# Patient Record
Sex: Female | Born: 2000 | Hispanic: Yes | Marital: Single | Smoking: Never smoker
Health system: Southern US, Community
[De-identification: ages and names within clinical notes are randomized; demographics above are authoritative.]

## PROBLEM LIST (undated history)

## (undated) DIAGNOSIS — A749 Chlamydial infection, unspecified: Secondary | ICD-10-CM

## (undated) DIAGNOSIS — B999 Unspecified infectious disease: Secondary | ICD-10-CM

## (undated) DIAGNOSIS — D649 Anemia, unspecified: Secondary | ICD-10-CM

## (undated) HISTORY — PX: NO PAST SURGERIES: SHX2092

---
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
## 2018-08-19 LAB — OB RESULTS CONSOLE HIV ANTIBODY (ROUTINE TESTING): HIV: NONREACTIVE

## 2018-08-19 LAB — OB RESULTS CONSOLE GC/CHLAMYDIA: Gonorrhea: NEGATIVE

## 2018-08-19 LAB — OB RESULTS CONSOLE ABO/RH: RH TYPE: POSITIVE

## 2018-08-19 LAB — OB RESULTS CONSOLE ANTIBODY SCREEN: Antibody Screen: NEGATIVE

## 2018-08-19 LAB — OB RESULTS CONSOLE HEPATITIS B SURFACE ANTIGEN: Hepatitis B Surface Ag: NEGATIVE

## 2018-08-19 LAB — OB RESULTS CONSOLE RPR: RPR: NONREACTIVE

## 2018-08-19 LAB — OB RESULTS CONSOLE RUBELLA ANTIBODY, IGM: Rubella: IMMUNE

## 2018-10-11 LAB — OB RESULTS CONSOLE GC/CHLAMYDIA
Chlamydia: NEGATIVE
Gonorrhea: NEGATIVE

## 2018-10-17 LAB — OB RESULTS CONSOLE GBS: GBS: POSITIVE

## 2018-10-21 ENCOUNTER — Encounter (HOSPITAL_COMMUNITY): Payer: Self-pay

## 2018-10-21 ENCOUNTER — Inpatient Hospital Stay (HOSPITAL_COMMUNITY)
Admission: AD | Admit: 2018-10-21 | Discharge: 2018-10-25 | DRG: 807 | Disposition: A | Discharge status: Home / Self Care | Payer: Medicaid Other | Source: Ambulatory Visit | Attending: Obstetrics and Gynecology | Admitting: Obstetrics and Gynecology

## 2018-10-21 DIAGNOSIS — O99824 Streptococcus B carrier state complicating childbirth: Secondary | ICD-10-CM | POA: Diagnosis present

## 2018-10-21 DIAGNOSIS — O9882 Other maternal infectious and parasitic diseases complicating childbirth: Secondary | ICD-10-CM | POA: Diagnosis not present

## 2018-10-21 DIAGNOSIS — Z3A38 38 weeks gestation of pregnancy: Secondary | ICD-10-CM | POA: Diagnosis not present

## 2018-10-21 DIAGNOSIS — O1414 Severe pre-eclampsia complicating childbirth: Principal | ICD-10-CM | POA: Diagnosis present

## 2018-10-21 DIAGNOSIS — O9902 Anemia complicating childbirth: Secondary | ICD-10-CM | POA: Diagnosis present

## 2018-10-21 DIAGNOSIS — D649 Anemia, unspecified: Secondary | ICD-10-CM | POA: Diagnosis present

## 2018-10-21 DIAGNOSIS — O99214 Obesity complicating childbirth: Secondary | ICD-10-CM | POA: Diagnosis present

## 2018-10-21 DIAGNOSIS — Z3A37 37 weeks gestation of pregnancy: Secondary | ICD-10-CM | POA: Diagnosis not present

## 2018-10-21 DIAGNOSIS — O1413 Severe pre-eclampsia, third trimester: Secondary | ICD-10-CM | POA: Diagnosis not present

## 2018-10-21 DIAGNOSIS — O1493 Unspecified pre-eclampsia, third trimester: Secondary | ICD-10-CM | POA: Diagnosis not present

## 2018-10-21 DIAGNOSIS — B951 Streptococcus, group B, as the cause of diseases classified elsewhere: Secondary | ICD-10-CM

## 2018-10-21 HISTORY — DX: Anemia, unspecified: D64.9

## 2018-10-21 HISTORY — DX: Chlamydial infection, unspecified: A74.9

## 2018-10-21 LAB — URINALYSIS, ROUTINE W REFLEX MICROSCOPIC
BILIRUBIN URINE: NEGATIVE
Glucose, UA: NEGATIVE mg/dL
Ketones, ur: NEGATIVE mg/dL
Nitrite: NEGATIVE
PROTEIN: 30 mg/dL — AB
Specific Gravity, Urine: 1.025 (ref 1.005–1.030)
pH: 6 (ref 5.0–8.0)

## 2018-10-21 LAB — COMPREHENSIVE METABOLIC PANEL
ALT: 28 U/L (ref 0–44)
ANION GAP: 6 (ref 5–15)
AST: 31 U/L (ref 15–41)
Albumin: 2.8 g/dL — ABNORMAL LOW (ref 3.5–5.0)
Alkaline Phosphatase: 117 U/L (ref 38–126)
BUN: 10 mg/dL (ref 6–20)
CO2: 21 mmol/L — ABNORMAL LOW (ref 22–32)
Calcium: 8.6 mg/dL — ABNORMAL LOW (ref 8.9–10.3)
Chloride: 107 mmol/L (ref 98–111)
Creatinine, Ser: 0.56 mg/dL (ref 0.44–1.00)
GFR calc Af Amer: >60 mL/min (ref >60)
GFR calc non Af Amer: >60 mL/min (ref >60)
Glucose, Bld: 90 mg/dL (ref 70–99)
Potassium: 3.7 mmol/L (ref 3.5–5.1)
Sodium: 134 mmol/L — ABNORMAL LOW (ref 135–145)
Total Bilirubin: 0.6 mg/dL (ref 0.3–1.2)
Total Protein: 6 g/dL — ABNORMAL LOW (ref 6.5–8.1)

## 2018-10-21 LAB — CBC
HCT: 35.6 % — ABNORMAL LOW (ref 36.0–46.0)
Hemoglobin: 11.7 g/dL — ABNORMAL LOW (ref 12.0–15.0)
MCH: 30.6 pg (ref 26.0–34.0)
MCHC: 32.9 g/dL (ref 30.0–36.0)
MCV: 93.2 fL (ref 80.0–100.0)
PLATELETS: 237 10*3/uL (ref 150–400)
RBC: 3.82 MIL/uL — ABNORMAL LOW (ref 3.87–5.11)
RDW: 13 % (ref 11.5–15.5)
WBC: 9.5 10*3/uL (ref 4.0–10.5)
nRBC: 0 % (ref 0.0–0.2)

## 2018-10-21 LAB — PROTEIN / CREATININE RATIO, URINE
Creatinine, Urine: 121 mg/dL
PROTEIN CREATININE RATIO: 0.33 mg/mg{creat} — AB (ref 0.00–0.15)
Total Protein, Urine: 40 mg/dL

## 2018-10-21 LAB — URINALYSIS, MICROSCOPIC (REFLEX): RBC / HPF: NONE SEEN RBC/hpf (ref 0–5)

## 2018-10-21 LAB — TYPE AND SCREEN
ABO/RH(D): O POS
Antibody Screen: NEGATIVE

## 2018-10-21 MED ORDER — ACETAMINOPHEN 500 MG PO TABS
1000.0000 mg | ORAL_TABLET | Freq: Once | ORAL | Status: AC
  Administered 2018-10-21: 1000 mg via ORAL
  Filled 2018-10-21: qty 2

## 2018-10-21 MED ORDER — MAGNESIUM SULFATE BOLUS VIA INFUSION
4.0000 g | Freq: Once | INTRAVENOUS | Status: AC
  Administered 2018-10-21: 4 g via INTRAVENOUS
  Filled 2018-10-21: qty 500

## 2018-10-21 MED ORDER — SOD CITRATE-CITRIC ACID 500-334 MG/5ML PO SOLN: 30.0000 mL | ORAL | Status: DC | PRN

## 2018-10-21 MED ORDER — OXYTOCIN 40 UNITS IN LACTATED RINGERS INFUSION - SIMPLE MED
2.5000 [IU]/h | INTRAVENOUS | Status: DC
  Filled 2018-10-21: qty 1000

## 2018-10-21 MED ORDER — LACTATED RINGERS IV SOLN: 500.0000 mL | INTRAVENOUS | Status: DC | PRN

## 2018-10-21 MED ORDER — OXYCODONE-ACETAMINOPHEN 5-325 MG PO TABS: 2.0000 | ORAL_TABLET | ORAL | Status: DC | PRN

## 2018-10-21 MED ORDER — SODIUM CHLORIDE 0.9 % IV SOLN
5.0000 10*6.[IU] | Freq: Once | INTRAVENOUS | Status: AC
  Administered 2018-10-21: 5 10*6.[IU] via INTRAVENOUS
  Filled 2018-10-21: qty 5

## 2018-10-21 MED ORDER — OXYCODONE-ACETAMINOPHEN 5-325 MG PO TABS: 1.0000 | ORAL_TABLET | ORAL | Status: DC | PRN

## 2018-10-21 MED ORDER — MAGNESIUM SULFATE 40 G IN LACTATED RINGERS - SIMPLE
2.0000 g/h | INTRAVENOUS | Status: DC
  Administered 2018-10-21 – 2018-10-23 (×3): 2 g/h via INTRAVENOUS
  Filled 2018-10-21 (×3): qty 500

## 2018-10-21 MED ORDER — LABETALOL HCL 5 MG/ML IV SOLN: 80.0000 mg | INTRAVENOUS | Status: DC | PRN

## 2018-10-21 MED ORDER — LACTATED RINGERS IV SOLN
INTRAVENOUS | Status: DC
  Administered 2018-10-21 – 2018-10-23 (×4): via INTRAVENOUS
  Administered 2018-10-23: 82 mL/h via INTRAVENOUS

## 2018-10-21 MED ORDER — LABETALOL HCL 5 MG/ML IV SOLN
20.0000 mg | INTRAVENOUS | Status: DC | PRN
  Filled 2018-10-21: qty 4

## 2018-10-21 MED ORDER — HYDRALAZINE HCL 20 MG/ML IJ SOLN: 10.0000 mg | INTRAMUSCULAR | Status: DC | PRN

## 2018-10-21 MED ORDER — MISOPROSTOL 50MCG HALF TABLET
50.0000 ug | ORAL_TABLET | ORAL | Status: DC
  Administered 2018-10-21 – 2018-10-22 (×4): 50 ug via BUCCAL
  Filled 2018-10-21 (×4): qty 1

## 2018-10-21 MED ORDER — PENICILLIN G 3 MILLION UNITS IVPB - SIMPLE MED
3.0000 10*6.[IU] | INTRAVENOUS | Status: DC
  Administered 2018-10-22 – 2018-10-23 (×12): 3 10*6.[IU] via INTRAVENOUS
  Filled 2018-10-21 (×16): qty 100

## 2018-10-21 MED ORDER — OXYTOCIN BOLUS FROM INFUSION
500.0000 mL | Freq: Once | INTRAVENOUS | Status: AC
  Administered 2018-10-23: 500 mL via INTRAVENOUS

## 2018-10-21 MED ORDER — ACETAMINOPHEN 325 MG PO TABS
650.0000 mg | ORAL_TABLET | ORAL | Status: DC | PRN
  Administered 2018-10-22 (×2): 650 mg via ORAL
  Filled 2018-10-21 (×2): qty 2

## 2018-10-21 MED ORDER — TERBUTALINE SULFATE 1 MG/ML IJ SOLN
0.2500 mg | Freq: Once | INTRAMUSCULAR | Status: DC | PRN
  Filled 2018-10-21: qty 1

## 2018-10-21 MED ORDER — LIDOCAINE HCL (PF) 1 % IJ SOLN
30.0000 mL | INTRAMUSCULAR | Status: DC | PRN
  Administered 2018-10-23: 30 mL via SUBCUTANEOUS
  Filled 2018-10-21: qty 30

## 2018-10-21 MED ORDER — ONDANSETRON HCL 4 MG/2ML IJ SOLN: 4.0000 mg | Freq: Four times a day (QID) | INTRAMUSCULAR | Status: DC | PRN

## 2018-10-21 MED ORDER — LABETALOL HCL 5 MG/ML IV SOLN: 40.0000 mg | INTRAVENOUS | Status: DC | PRN

## 2018-10-21 MED ORDER — FLEET ENEMA 7-19 GM/118ML RE ENEM: 1.0000 | ENEMA | RECTAL | Status: DC | PRN

## 2018-10-21 NOTE — MAU Provider Note (Signed)
Chief Complaint:  Epistaxis and Headache   First Provider Initiated Contact with Patient 10/21/18 1745     HPI: Allison Ferrell is a 17 y.o. G1P0 at 4174w5d who presents to maternity admissions reporting headache & nose bleeds. Symptoms started today. Reports a borderline BP at her last OB visit (135/85) but otherwise denies any history of hypertension. Reports dark red nosebleed off & on today. Bleeding lasts for a few minutes at a time. Headache since this morning. Felt dizzy & reports spots in vision last night. Denies epigastric pain.  Denies contractions, leakage of fluid or vaginal bleeding. Good fetal movement.  Location: head Quality: aching Severity: 4/10 in pain scale Duration: 1 day Timing: constant Modifying factors: nothing makes better or worse Associated signs and symptoms: nose bleed, htn, dizziness   Past Medical History:  Diagnosis Date  . Anemia   . Chlamydia    OB History  Gravida Para Term Preterm AB Living  1            SAB TAB Ectopic Multiple Live Births               # Outcome Date GA Lbr Len/2nd Weight Sex Delivery Anes PTL Lv  1 Current            History reviewed. No pertinent surgical history. No family history on file. Social History   Tobacco Use  . Smoking status: Never Smoker  . Smokeless tobacco: Never Used  Substance Use Topics  . Alcohol use: Never    Frequency: Never  . Drug use: Never   No Known Allergies No medications prior to admission.    I have reviewed patient's Past Medical Hx, Surgical Hx, Family Hx, Social Hx, medications and allergies.   ROS:  Review of Systems  HENT: Positive for nosebleeds. Negative for sneezing and sore throat.   Eyes: Positive for visual disturbance.  Respiratory: Negative.  Negative for cough and shortness of breath.   Gastrointestinal: Negative.   Genitourinary: Negative.   Neurological: Positive for dizziness and headaches.    Physical Exam   Patient Vitals for the past 24 hrs:  BP Temp Temp src Pulse Resp SpO2 Weight  10/21/18 1845 (!) 158/100 - - 87 - - -  10/21/18 1843 (!) 159/103 - - 81 - - -  10/21/18 1820 (!) 156/119 - - 90 - - -  10/21/18 1800 (!) 149/98 - - 91 - - -  10/21/18 1741 (!) 147/103 - - 93 - - -  10/21/18 1734 (!) 144/93 - - 96 - - -  10/21/18 1718 137/89 - - 92 - - -  10/21/18 1716 (!) 155/89 98.3 F (36.8 C) Oral 81 18 100 % 93.4 kg    Constitutional: Well-developed, well-nourished female in no acute distress.  Cardiovascular: normal rate & rhythm, no murmur Respiratory: normal effort, lung sounds clear throughout GI: Abd soft, non-tender, gravid appropriate for gestational age. Pos BS x 4 MS: Extremities nontender, no edema, normal ROM Neurologic: Alert and oriented x 4. DTR 2+ BLE GU:     Dilation: Closed Exam by:: Estanislado SpireE Gurtha Picker NP  NST:  Baseline: 140 bpm, Variability: Good {> 6 bpm), Accelerations: Reactive and Decelerations: Absent   Pt informed that the ultrasound is considered a limited OB ultrasound and is not intended to be a complete ultrasound exam.  Patient also informed that the ultrasound is not being completed with the intent of assessing for fetal or placental anomalies or any pelvic abnormalities.  Explained that  the purpose of today's ultrasound is to assess for  presentation.  Patient acknowledges the purpose of the exam and the limitations of the study.  cephalic     Labs: Results for orders placed or performed during the hospital encounter of 10/21/18 (from the past 24 hour(s))  Urinalysis, Routine w reflex microscopic     Status: Abnormal   Collection Time: 10/21/18  5:37 PM  Result Value Ref Range   Color, Urine YELLOW YELLOW   APPearance HAZY (A) CLEAR   Specific Gravity, Urine 1.025 1.005 - 1.030   pH 6.0 5.0 - 8.0   Glucose, UA NEGATIVE NEGATIVE mg/dL   Hgb urine dipstick TRACE (A) NEGATIVE   Bilirubin Urine NEGATIVE NEGATIVE   Ketones, ur NEGATIVE NEGATIVE mg/dL   Protein, ur 30 (A) NEGATIVE mg/dL    Nitrite NEGATIVE NEGATIVE   Leukocytes, UA SMALL (A) NEGATIVE  Urinalysis, Microscopic (reflex)     Status: Abnormal   Collection Time: 10/21/18  5:37 PM  Result Value Ref Range   RBC / HPF NONE SEEN 0 - 5 RBC/hpf   WBC, UA 6-10 0 - 5 WBC/hpf   Bacteria, UA FEW (A) NONE SEEN   Squamous Epithelial / LPF 11-20 0 - 5  CBC     Status: Abnormal   Collection Time: 10/21/18  6:29 PM  Result Value Ref Range   WBC 9.5 4.0 - 10.5 K/uL   RBC 3.82 (L) 3.87 - 5.11 MIL/uL   Hemoglobin 11.7 (L) 12.0 - 15.0 g/dL   HCT 16.1 (L) 09.6 - 04.5 %   MCV 93.2 80.0 - 100.0 fL   MCH 30.6 26.0 - 34.0 pg   MCHC 32.9 30.0 - 36.0 g/dL   RDW 40.9 81.1 - 91.4 %   Platelets 237 150 - 400 K/uL   nRBC 0.0 0.0 - 0.2 %    Imaging:  No results found.  MAU Course: Orders Placed This Encounter  Procedures  . Urinalysis, Routine w reflex microscopic  . CBC  . Comprehensive metabolic panel  . Protein / creatinine ratio, urine  . Urinalysis, Microscopic (reflex)  . RPR  . Order Rapid HIV per protocol if no results on chart  . Type and screen [facility] [place]  . Insert and maintain IV Line   Meds ordered this encounter  Medications  . acetaminophen (TYLENOL) tablet 1,000 mg    MDM: Elevated BPs. Severe range x 1. Tylenol 1 gm PO for headache. U/a with 30 of protein & rest of PEC labs pending. C/w Dr. Alysia Penna. Will admit.   Assessment: 1. Pre-eclampsia in third trimester   2. [redacted] weeks gestation of pregnancy   3. Positive GBS test     Plan: Admit to birthing suites Confirmed vertex by bedside scan GBS positive, NKDA, abx ordered PEC labs pending Care turned over to labor team  Judeth Horn, NP 10/21/2018 6:48 PM

## 2018-10-21 NOTE — Progress Notes (Signed)
LABOR PROGRESS NOTE  Allison Ferrell is a 17 y.o. G1P0 at 4035w5d admitted for induction of labor for pre-eclampsia. Pregnancy complicated by GBS positive, late prenatal care, chlamydia this pregnancy with unknown TOC  Subjective: Feels well, not feeling contractions. No bleeding or LOF, baby is moving normally  Objective: BP (!) 137/97   Pulse 84   Temp 98.7 F (37.1 C) (Oral)   Resp 18   Ht 5\' 2"  (1.575 m)   Wt 93.4 kg   SpO2 100%   BMI 37.66 kg/m  or  Vitals:   10/21/18 1843 10/21/18 1845 10/21/18 1925 10/21/18 1929  BP: (!) 159/103 (!) 158/100 (!) 137/97   Pulse: 81 87 84   Resp:   18   Temp:   98.7 F (37.1 C)   TempSrc:   Oral   SpO2:      Weight:    93.4 kg  Height:    5\' 2"  (1.575 m)   Dilation: Closed Presentation: Vertex(verifed by bedside US ) Exam by:: Dareen PianoAnderson, DO  FHT: baseline rate 120s, moderate varibility, positive acel, negative decel Toco: every 5 min  Labs: Lab Results  Component Value Date   WBC 9.5 10/21/2018   HGB 11.7 (L) 10/21/2018   HCT 35.6 (L) 10/21/2018   MCV 93.2 10/21/2018   PLT 237 10/21/2018    Patient Active Problem List   Diagnosis Date Noted  . Preeclampsia, third trimester 10/21/2018    Assessment / Plan: 17 y.o. G1P0 at 1135w5d here for induction of labor for pre-eclampsia. Pregnancy complicated by GBS positive, late PNC, chlamydia this pregnancy with unknown TOC.  -P/C ratio 0.33, plt and AST/ALT wnl -continue antibiotics for GBS status -needs TOC for chlamydia   Labor: cephalic by bedside US, cytotec for cervical ripening Fetal Wellbeing:  Reactive  Pain Control:  IV pain medication per patient request Anticipated MOD:  vaginal  Darreon Lutes Londell MohHast Rayetta Veith, MD PGY3 10/21/2018, 7:46 PM

## 2018-10-21 NOTE — MAU Note (Addendum)
Pt has had nose bleeds on and off today. Had some headaches. The nose bleeds do stop but start again, no trauma to the area. Was told by her office to come in. Pt states she's had some mildly elevated BP in the office. Headache is currently 4/10, did not take anything for the headache.

## 2018-10-22 ENCOUNTER — Other Ambulatory Visit: Payer: Self-pay

## 2018-10-22 LAB — ABO/RH: ABO/RH(D): O POS

## 2018-10-22 LAB — RPR: RPR Ser Ql: NONREACTIVE

## 2018-10-22 LAB — GC/CHLAMYDIA PROBE AMP (~~LOC~~) NOT AT ARMC
Chlamydia: NEGATIVE
Neisseria Gonorrhea: NEGATIVE

## 2018-10-22 MED ORDER — FENTANYL CITRATE (PF) 100 MCG/2ML IJ SOLN
100.0000 ug | INTRAMUSCULAR | Status: DC | PRN
  Administered 2018-10-23 (×2): 100 ug via INTRAVENOUS
  Filled 2018-10-22 (×2): qty 2

## 2018-10-22 MED ORDER — MISOPROSTOL 25 MCG QUARTER TABLET
25.0000 ug | ORAL_TABLET | ORAL | Status: DC | PRN
  Administered 2018-10-22 – 2018-10-23 (×4): 25 ug via VAGINAL
  Filled 2018-10-22 (×4): qty 1

## 2018-10-22 NOTE — Progress Notes (Signed)
OB/GYN Faculty Practice: Labor Progress Note  Subjective: Feeling contractions every few minutes.   Objective: BP 128/84   Pulse 80   Temp 98.4 F (36.9 C) (Oral)   Resp 16   Ht 5\' 2"  (1.575 m)   Wt 93.4 kg   SpO2 100%   BMI 37.66 kg/m  Gen: well-appearing, NAD Dilation: 1 Effacement (%): Thick Station: Ballotable Presentation: Vertex Exam by:: Wandra Mannan. Pastva, RN  Assessment and Plan: 17 y.o. G1P0 71102w6d here for IOL for severe preeclampsia (headaches, vision changes and 1 severe range BP) while initially presented to MAU for nosebleeds.   Labor: Induction started around noon 10/22/18 with cytotec. FB still in place. Will give 7th dose of cytotec (now vaginal, previously buccal) -- pain control: plans for epidural -- PPH Risk: medium/high (expect prolonged labor course given Mg++)  Fetal Well-Being: EFW limited by body habitus, was 50% at anatomy U/S, likely 7-8lbs by Leopolds. Cephalic by sutures.  -- Category I - continuous fetal monitoring  -- GBS positive   Severe Preeclampsia (HA, vision changes, 1x severe BP):  BP well-controlled at this time, moderate range BP. Baseline UPC 0.33, HELLP labs wnl. -- continue to monitor Bps -- continue Mg++  Joni Norrod S. Earlene PlaterWallace, DO OB/GYN Fellow, Faculty Practice  10:11 PM

## 2018-10-22 NOTE — Progress Notes (Signed)
LABOR PROGRESS NOTE  Bonnita Nasutilejandra Garcia Ozuna is a 17 y.o. G1P0 at 6950w6d  admitted for IOL for severe Pre-E.   Subjective: Strip note. Discussed plan of care with RN.   Objective: BP 124/66   Pulse 81   Temp 98.7 F (37.1 C) (Oral)   Resp 18   Ht 5\' 2"  (1.575 m)   Wt 93.4 kg   SpO2 100%   BMI 37.66 kg/m  or  Vitals:   10/21/18 2201 10/21/18 2243 10/21/18 2300 10/22/18 0000  BP: 135/89 139/89 (!) 145/87 124/66  Pulse: 81 82 85 81  Resp: 16 18 18    Temp:      TempSrc:      SpO2:      Weight:      Height:        Dilation: Closed Presentation: Vertex Exam by:: Suezanne JacquetJ Williams, RN FHT: baseline rate 130, moderate varibility, +acel, no decel Toco: q2-5 min   Labs: Lab Results  Component Value Date   WBC 9.5 10/21/2018   HGB 11.7 (L) 10/21/2018   HCT 35.6 (L) 10/21/2018   MCV 93.2 10/21/2018   PLT 237 10/21/2018    Patient Active Problem List   Diagnosis Date Noted  . Preeclampsia, third trimester 10/21/2018    Assessment / Plan: 17 y.o. G1P0 at 2850w6d here for IOL for severe Pre-E.   Pre-E: On Mg. No recent severe range pressures.  Labor: Induction. Second cytotec placed at 1200.  Fetal Wellbeing:  Cat I. Patient with approximately 4 min FHT concerning for sinusoidal pattern that resolved to Cat I with position change. Continue to monitor closely. Dr. Alysia PennaErvin was made aware and reviewed strip.  Pain Control:  Plans for epidural  Anticipated MOD:  NSVD   Marcy Sirenatherine Wallace, D.O. OB Fellow  10/22/2018, 12:22 AM

## 2018-10-22 NOTE — Progress Notes (Signed)
LABOR PROGRESS NOTE  Allison Ferrell is a 17 y.o. G1P0 at 9396w6d  admitted for induction of labor for preeclampsia with SF (headaches, vision changes, 1 severe range BP).   Subjective: Doing well. Starting to have more painful contractions. Would like to have IV pain medication on request  Objective: BP (!) 149/93 (BP Location: Right Arm)   Pulse 76   Temp 98 F (36.7 C) (Oral)   Resp 17   Ht 5\' 2"  (1.575 m)   Wt 93.4 kg   SpO2 100%   BMI 37.66 kg/m  or  Vitals:   10/22/18 1356 10/22/18 1500 10/22/18 1600 10/22/18 1651  BP: 128/74 118/80 135/84 (!) 149/93  Pulse: 90 83 87 76  Resp: 16 16 16 17   Temp: (!) 97.3 F (36.3 C)  98 F (36.7 C)   TempSrc: Oral  Oral   SpO2:      Weight:      Height:        Dilation: 1 Effacement (%): Thick Station: Ballotable Presentation: Vertex Exam by:: Wandra Mannan. Pastva, RN FHT: baseline rate 135, moderate varibility, + acel, - decel Toco: irregular contractions  Labs: Lab Results  Component Value Date   WBC 9.5 10/21/2018   HGB 11.7 (L) 10/21/2018   HCT 35.6 (L) 10/21/2018   MCV 93.2 10/21/2018   PLT 237 10/21/2018    Patient Active Problem List   Diagnosis Date Noted  . Preeclampsia, third trimester 10/21/2018    Assessment / Plan: 17 y.o. G1P0 at 5496w6d here for IOL for preeclampsia with severe features (headaches, vision changes, 1 severe range BP) after initially presenting to MAU for nosebleed.  Severe preeclampsia-BP controlled at this time without medication. Baseline UPC 0.33, HELLP wnl  Labor: cytotec x6, FB placed around 1200 on 10/22/18 --IV pain medication per patient request, would like to try and delivery without epidural Fetal Wellbeing:  EFW limited by body habitus, was 50%ile on anatomy scan --reactive strip --GBS positive, receiving PCN  Anticipated MOD:  Anticipate vaginal delivery  Justine NullBridgid Saarah Dewing, MD PGY-3 Family Medicine 10/22/2018, 6:41 PM

## 2018-10-22 NOTE — H&P (Addendum)
LABOR AND DELIVERY ADMISSION HISTORY AND PHYSICAL NOTE  Allison Ferrell is a 17 y.o. female G1P0 with IUP at 68106w6d presenting for IOL for pre-eclampsia. She was seen in the MAU yesterday for HA and nosebleeds and found to have elevated BP's (severe range x 1) and UA with 30 mg/dL of protein.   She reports positive fetal movement. She denies leakage of fluid or vaginal bleeding.  Prenatal History/Complications: PNC at HD Pregnancy complications:  - Late prenatal care - Pre-eclampsia - GBS positive - Chlamydia during pregnancy, unknown TOC  Past Medical History: Past Medical History:  Diagnosis Date  . Anemia   . Chlamydia     Past Surgical History: History reviewed. No pertinent surgical history.  Obstetrical History: OB History    Gravida  1   Para      Term      Preterm      AB      Living        SAB      TAB      Ectopic      Multiple      Live Births              Social History: Social History   Socioeconomic History  . Marital status: Single    Spouse name: Not on file  . Number of children: Not on file  . Years of education: Not on file  . Highest education level: Not on file  Occupational History  . Not on file  Social Needs  . Financial resource strain: Not on file  . Food insecurity:    Worry: Not on file    Inability: Not on file  . Transportation needs:    Medical: Not on file    Non-medical: Not on file  Tobacco Use  . Smoking status: Never Smoker  . Smokeless tobacco: Never Used  Substance and Sexual Activity  . Alcohol use: Never    Frequency: Never  . Drug use: Never  . Sexual activity: Not on file  Lifestyle  . Physical activity:    Days per week: Not on file    Minutes per session: Not on file  . Stress: Not on file  Relationships  . Social connections:    Talks on phone: Not on file    Gets together: Not on file    Attends religious service: Not on file    Active member of club or organization: Not on  file    Attends meetings of clubs or organizations: Not on file    Relationship status: Not on file  Other Topics Concern  . Not on file  Social History Narrative  . Not on file    Family History: History reviewed. No pertinent family history.  Allergies: No Known Allergies  No medications prior to admission.     Review of Systems  All systems reviewed and negative except as stated in HPI  Physical Exam Blood pressure 124/66, pulse 81, temperature 98.7 F (37.1 C), temperature source Oral, resp. rate 19, height 5\' 2"  (1.575 m), weight 93.4 kg, SpO2 100 %. General appearance: alert, oriented, NAD Lungs: normal respiratory effort Heart: regular rate Abdomen: soft, non-tender; gravid, FH appropriate for GA Extremities: No calf swelling or tenderness Presentation: vertex Fetal monitoring: Baseline HR 145, 15x15 Uterine activity: Mild contractions, 60-110s duration, 2-5 min frequency. Dilation: Closed Exam by:: Suezanne JacquetJ Williams, RN  Prenatal labs: ABO, Rh: --/--/O POS (12/16 1827) Antibody: NEG (12/16 1827) Rubella: Immune (10/14 0000)  RPR: Nonreactive (10/14 0000)  HBsAg: Negative (10/14 0000)  HIV: Non-reactive (10/14 0000)  GC/Chlamydia: Positive Chlamydia 08/19/18 GBS: Positive (12/12 0000)  1-hr GTT: 103 Genetic screening: Neg CF screen, Quad screen not completed Anatomy US: None  Prenatal Transfer Tool  Maternal Diabetes: No Genetic Screening: Declined Maternal Ultrasounds/Referrals: Declined Fetal Ultrasounds or other Referrals:  None Maternal Substance Abuse:  No Significant Maternal Medications:  None Significant Maternal Lab Results: Lab values include: Other: Protein Creatinine Ratio .33, 30mg /dL protein in urine.  Results for orders placed or performed during the hospital encounter of 10/21/18 (from the past 24 hour(s))  Urinalysis, Routine w reflex microscopic   Collection Time: 10/21/18  5:37 PM  Result Value Ref Range   Color, Urine YELLOW YELLOW    APPearance HAZY (A) CLEAR   Specific Gravity, Urine 1.025 1.005 - 1.030   pH 6.0 5.0 - 8.0   Glucose, UA NEGATIVE NEGATIVE mg/dL   Hgb urine dipstick TRACE (A) NEGATIVE   Bilirubin Urine NEGATIVE NEGATIVE   Ketones, ur NEGATIVE NEGATIVE mg/dL   Protein, ur 30 (A) NEGATIVE mg/dL   Nitrite NEGATIVE NEGATIVE   Leukocytes, UA SMALL (A) NEGATIVE  Protein / creatinine ratio, urine   Collection Time: 10/21/18  5:37 PM  Result Value Ref Range   Creatinine, Urine 121.00 mg/dL   Total Protein, Urine 40 mg/dL   Protein Creatinine Ratio 0.33 (H) 0.00 - 0.15 mg/mg[Cre]  Urinalysis, Microscopic (reflex)   Collection Time: 10/21/18  5:37 PM  Result Value Ref Range   RBC / HPF NONE SEEN 0 - 5 RBC/hpf   WBC, UA 6-10 0 - 5 WBC/hpf   Bacteria, UA FEW (A) NONE SEEN   Squamous Epithelial / LPF 11-20 0 - 5  Type and screen [facility] [place]   Collection Time: 10/21/18  6:27 PM  Result Value Ref Range   ABO/RH(D) O POS    Antibody Screen NEG    Sample Expiration      10/24/2018 Performed at [facility], [address] [place], [place] [address]   CBC   Collection Time: 10/21/18  6:29 PM  Result Value Ref Range   WBC 9.5 4.0 - 10.5 K/uL   RBC 3.82 (L) 3.87 - 5.11 MIL/uL   Hemoglobin 11.7 (L) 12.0 - 15.0 g/dL   HCT 60.4 (L) 54.0 - 98.1 %   MCV 93.2 80.0 - 100.0 fL   MCH 30.6 26.0 - 34.0 pg   MCHC 32.9 30.0 - 36.0 g/dL   RDW 19.1 47.8 - 29.5 %   Platelets 237 150 - 400 K/uL   nRBC 0.0 0.0 - 0.2 %  Comprehensive metabolic panel   Collection Time: 10/21/18  6:29 PM  Result Value Ref Range   Sodium 134 (L) 135 - 145 mmol/L   Potassium 3.7 3.5 - 5.1 mmol/L   Chloride 107 98 - 111 mmol/L   CO2 21 (L) 22 - 32 mmol/L   Glucose, Bld 90 70 - 99 mg/dL   BUN 10 6 - 20 mg/dL   Creatinine, Ser 6.21 0.44 - 1.00 mg/dL   Calcium 8.6 (L) 8.9 - 10.3 mg/dL   Total Protein 6.0 (L) 6.5 - 8.1 g/dL   Albumin 2.8 (L) 3.5 - 5.0 g/dL   AST 31 15 - 41 U/L   ALT 28 0 - 44 U/L   Alkaline  Phosphatase 117 38 - 126 U/L   Total Bilirubin 0.6 0.3 - 1.2 mg/dL   GFR calc non Af Amer >60 >60  mL/min   GFR calc Af Amer >60 >60 mL/min   Anion gap 6 5 - 15    Patient Active Problem List   Diagnosis Date Noted  . Preeclampsia, third trimester 10/21/2018    Assessment: Aymar Whitfill is a 17 y.o. G1P0 at [redacted]w[redacted]d here for IOL due to pre-eclampsia.   #Labor: Mild contractions every 2-5 min, 60-110s duration. #Pain: Plan for epidural  #FWB: Cat I. Patient with approximately 4 min FHT concerning for sinusoidal pattern that resolved to Cat I with position change. Continue to monitor closely. Dr. Alysia Penna was made aware and reviewed strip.  #ID: Chlamydia during pregnancy, TOC pending. GBS Positive, Penicillin started 0004 hrs. #MOF: Breast #MOC: Possibly IUD #Circ: Theone Murdoch MS3 10/22/2018, 12:29 AM   OB FELLOW HISTORY AND PHYSICAL ATTESTATION  I have seen and examined this patient; I agree with above documentation in the resident's note.   Marcy Siren, D.O. OB Fellow  10/22/2018, 2:55 AM

## 2018-10-22 NOTE — Progress Notes (Signed)
OB/GYN Faculty Practice: Labor Progress Note  Subjective: Sleeping, no complaints.   Objective: BP 128/74   Pulse 90   Temp (!) 97.3 F (36.3 C) (Oral)   Resp 16   Ht 5\' 2"  (1.575 m)   Wt 93.4 kg   SpO2 100%   BMI 37.66 kg/m  Gen: well-appearing, NAD Dilation: Fingertip Station: Ballotable Presentation: Vertex Exam by:: Dr. Earlene PlaterWallace  Assessment and Plan: 17 y.o. G1P0 5541w6d here for IOL for severe preeclampsia (headaches, vision changes and 1 severe range BP) while initially presented to MAU for nosebleeds.   Labor: Induction started around noon 10/22/18 with cytotec. FB placed with speculum as still fingertip. Will switch to vaginal cytotec (5th dose).  -- pain control: plans for epidural -- PPH Risk: medium/high (expect prolonged labor course given Mg++)  Fetal Well-Being: EFW limited by body habitus, was 50% at anatomy U/S, likely 7-8lbs by Leopolds. Cephalic by sutures.  -- Category I - continuous fetal monitoring  -- GBS positive   Severe Preeclampsia (HA, vision changes, 1x severe BP):  BP well-controlled at this time, moderate range BP. Baseline UPC 0.33, HELLP labs wnl. -- continue to monitor Bps  Shaila Gilchrest S. Earlene PlaterWallace, DO OB/GYN Fellow, Faculty Practice  2:14 PM

## 2018-10-22 NOTE — Progress Notes (Signed)
IV saline locked and Monitors removed for patient to shower.  FHR shows Category I tracing.  Level of consciousness appropriate.  Patient ambulated perfectly from bed to shower.

## 2018-10-22 NOTE — Progress Notes (Signed)
OB/GYN Faculty Practice: Labor Progress Note  Subjective: Sleeping when entered room. NAD, brother and mother in the room.   Objective: BP (!) 139/92   Pulse 78   Temp 97.7 F (36.5 C) (Oral)   Resp 16   Ht 5\' 2"  (1.575 m)   Wt 93.4 kg   SpO2 100%   BMI 37.66 kg/m  Gen: well-appearing, NAD Dilation: Fingertip Station: Ballotable Presentation: Vertex Exam by:: Grafton Warzecha  Assessment and Plan: 17 y.o. G1P0 4663w6d here for IOL for severe preeclampsia (headaches, vision changes and 1 severe range BP) while initially presented to MAU for nosebleeds.   Labor: Induction started around noon 10/22/18 with cytotec. Has received 3 doses of cytotec and remains FT. Will give one additional dose of cytotec then plan to place FB with speculum and switch to vaginal cytotec at next check.  -- pain control: plans for epidural -- PPH Risk: medium/high (expect prolonged labor course given Mg++)  Fetal Well-Being: EFW limited by body habitus, was 50% at anatomy U/S, likely 7-8lbs by Leopolds. Cephalic by sutures.  -- Category I - continuous fetal monitoring  -- GBS positive   Severe Preeclampsia (HA, vision changes, 1x severe BP):  BP well-controlled at this time, moderate range BP. Baseline UPC 0.33, HELLP labs wnl. -- continue to monitor Bps  History of Chlamydia: Positive in 10/19, negative test of recurrence 12/19. Spoke with RN at HD who will fax result to labor and delivery. Patient can have post-placental IUD placed.  Cristal DeerLaurel S. Earlene PlaterWallace, DO OB/GYN Fellow, Faculty Practice  9:51 AM

## 2018-10-22 NOTE — Progress Notes (Addendum)
LABOR PROGRESS NOTE  Allison Ferrell is a 17 y.o. G1P0 at 6219w6d  admitted for IOL for severe Pre-E.  Subjective: Has a mild HA. Epistaxis x 2 since 12pm, triggered by coughing. Resolved with applied pressure. Otherwise feeling well. Pt turned out lights, trying to get some rest.  Objective: BP 108/74   Pulse 90   Temp 98.7 F (37.1 C) (Oral)   Resp 16   Ht 5\' 2"  (1.575 m)   Wt 93.4 kg   SpO2 100%   BMI 37.66 kg/m  or  Vitals:   10/22/18 0105 10/22/18 0201 10/22/18 0301 10/22/18 0401  BP: 131/86 126/78 (!) 121/91 108/74  Pulse: 88 85 84 90  Resp: 16 17 18 16   Temp: 98.7 F (37.1 C)     TempSrc: Oral     SpO2:      Weight:      Height:        Dilation: Fingertip Station: Ballotable Presentation: Vertex Exam by:: Suezanne JacquetJ Williams, RN FHT: Baseline rate 125, moderate varibiality, 15 x 15 acel, no decel Toco: q5-6 min, 60-110s duration, mild  Labs: Lab Results  Component Value Date   WBC 9.5 10/21/2018   HGB 11.7 (L) 10/21/2018   HCT 35.6 (L) 10/21/2018   MCV 93.2 10/21/2018   PLT 237 10/21/2018    Patient Active Problem List   Diagnosis Date Noted  . Preeclampsia, third trimester 10/21/2018    Assessment / Plan: 17 y.o. G1P0 at 5619w6d here for IOL due to severe Pre-E.  Pre-E: On Mg, no recent severe ranges pressures.  Labor: IOL, 3rd Cytotec at 0400 Fetal Wellbeing:  Category 1 Pain Control:  Plans for epidural  Anticipated MOD:  SVD  [facility] Surgery CenterElizabeth Evanny Ellerbe MS3 Marcy Sirenatherine Wallace, D.O. OB Fellow  10/22/2018, 4:14 AM

## 2018-10-22 NOTE — Anesthesia Pain Management Evaluation Note (Signed)
  CRNA Pain Management Visit Note  Patient: Allison Ferrell, 17 y.o., female  "Hello [facility]. We have an anesthesia team available at all times to provide care throughout the hospital, including epidural management and anesthesia for C-section. I don't know your plan for the delivery whether it a natural birth, water birth, IV sedation, nitrous supplementation, doula or epidural, but we want to meet your pain goals."   1.Was your pain managed to your expectations on prior hospitalizations?   No prior hospitalizations  2.What is your expectation for pain management during this hospitalization?     Labor support without medications  3.How can we help you reach that goal? natural  Record the patient's initial score and the patient's pain goal.   Pain: 0  Pain Goal: 10 [facility] wants you to be able to say your pain was always managed very well.  Allison Ferrell 10/22/2018

## 2018-10-23 ENCOUNTER — Inpatient Hospital Stay (HOSPITAL_COMMUNITY): Payer: Medicaid Other | Admitting: Anesthesiology

## 2018-10-23 ENCOUNTER — Encounter (HOSPITAL_COMMUNITY): Payer: Self-pay | Admitting: Anesthesiology

## 2018-10-23 DIAGNOSIS — O9882 Other maternal infectious and parasitic diseases complicating childbirth: Secondary | ICD-10-CM

## 2018-10-23 DIAGNOSIS — Z3A38 38 weeks gestation of pregnancy: Secondary | ICD-10-CM

## 2018-10-23 DIAGNOSIS — O1413 Severe pre-eclampsia, third trimester: Secondary | ICD-10-CM

## 2018-10-23 LAB — COMPREHENSIVE METABOLIC PANEL
ALT: 25 U/L (ref 0–44)
AST: 26 U/L (ref 15–41)
Albumin: 2.7 g/dL — ABNORMAL LOW (ref 3.5–5.0)
Alkaline Phosphatase: 108 U/L (ref 38–126)
Anion gap: 9 (ref 5–15)
BUN: <5 mg/dL — ABNORMAL LOW (ref 6–20)
CO2: 20 mmol/L — ABNORMAL LOW (ref 22–32)
Calcium: 7 mg/dL — ABNORMAL LOW (ref 8.9–10.3)
Chloride: 104 mmol/L (ref 98–111)
Creatinine, Ser: 0.53 mg/dL (ref 0.44–1.00)
GFR calc Af Amer: >60 mL/min (ref >60)
GFR calc non Af Amer: >60 mL/min (ref >60)
Glucose, Bld: 89 mg/dL (ref 70–99)
Potassium: 3.4 mmol/L — ABNORMAL LOW (ref 3.5–5.1)
Sodium: 133 mmol/L — ABNORMAL LOW (ref 135–145)
Total Bilirubin: 1 mg/dL (ref 0.3–1.2)
Total Protein: 5.8 g/dL — ABNORMAL LOW (ref 6.5–8.1)

## 2018-10-23 LAB — CBC
HCT: 34 % — ABNORMAL LOW (ref 36.0–46.0)
HCT: 37.2 % (ref 36.0–46.0)
HCT: 35.1 % — ABNORMAL LOW (ref 36.0–46.0)
Hemoglobin: 11.4 g/dL — ABNORMAL LOW (ref 12.0–15.0)
Hemoglobin: 12.7 g/dL (ref 12.0–15.0)
Hemoglobin: 12 g/dL (ref 12.0–15.0)
MCH: 31.1 pg (ref 26.0–34.0)
MCH: 31.5 pg (ref 26.0–34.0)
MCH: 31.3 pg (ref 26.0–34.0)
MCHC: 33.5 g/dL (ref 30.0–36.0)
MCHC: 34.1 g/dL (ref 30.0–36.0)
MCHC: 34.2 g/dL (ref 30.0–36.0)
MCV: 92.9 fL (ref 80.0–100.0)
MCV: 92.3 fL (ref 80.0–100.0)
MCV: 91.4 fL (ref 80.0–100.0)
NRBC: 0 % (ref 0.0–0.2)
NRBC: 0 % (ref 0.0–0.2)
NRBC: 0 % (ref 0.0–0.2)
Platelets: 226 10*3/uL (ref 150–400)
Platelets: 250 10*3/uL (ref 150–400)
Platelets: 219 10*3/uL (ref 150–400)
RBC: 3.66 MIL/uL — ABNORMAL LOW (ref 3.87–5.11)
RBC: 4.03 MIL/uL (ref 3.87–5.11)
RBC: 3.84 MIL/uL — ABNORMAL LOW (ref 3.87–5.11)
RDW: 13.1 % (ref 11.5–15.5)
RDW: 13.1 % (ref 11.5–15.5)
RDW: 13.2 % (ref 11.5–15.5)
WBC: 10.7 10*3/uL — ABNORMAL HIGH (ref 4.0–10.5)
WBC: 13.9 10*3/uL — ABNORMAL HIGH (ref 4.0–10.5)
WBC: 18.7 10*3/uL — ABNORMAL HIGH (ref 4.0–10.5)

## 2018-10-23 LAB — MAGNESIUM: Magnesium: 4.5 mg/dL — ABNORMAL HIGH (ref 1.7–2.4)

## 2018-10-23 MED ORDER — MISOPROSTOL 50MCG HALF TABLET
50.0000 ug | ORAL_TABLET | Freq: Once | ORAL | Status: AC
  Administered 2018-10-23: 50 ug via BUCCAL
  Filled 2018-10-23: qty 1

## 2018-10-23 MED ORDER — EPHEDRINE 5 MG/ML INJ
10.0000 mg | INTRAVENOUS | Status: DC | PRN
  Filled 2018-10-23: qty 2

## 2018-10-23 MED ORDER — LIDOCAINE HCL (PF) 1 % IJ SOLN
INTRAMUSCULAR | Status: DC | PRN
  Administered 2018-10-23 (×2): 4 mL via EPIDURAL

## 2018-10-23 MED ORDER — LACTATED RINGERS IV SOLN: 500.0000 mL | Freq: Once | INTRAVENOUS | Status: DC

## 2018-10-23 MED ORDER — LACTATED RINGERS IV SOLN
500.0000 mL | Freq: Once | INTRAVENOUS | Status: AC
  Administered 2018-10-23: 500 mL via INTRAVENOUS

## 2018-10-23 MED ORDER — TRANEXAMIC ACID-NACL 1000-0.7 MG/100ML-% IV SOLN: 1000.0000 mg | Freq: Once | INTRAVENOUS | Status: DC

## 2018-10-23 MED ORDER — FENTANYL 2.5 MCG/ML BUPIVACAINE 1/10 % EPIDURAL INFUSION (WH - ANES)
14.0000 mL/h | INTRAMUSCULAR | Status: DC | PRN
  Administered 2018-10-23: 12 mL/h via EPIDURAL
  Administered 2018-10-23: 14 mL/h via EPIDURAL
  Filled 2018-10-23 (×2): qty 100

## 2018-10-23 MED ORDER — DIPHENHYDRAMINE HCL 50 MG/ML IJ SOLN: 12.5000 mg | INTRAMUSCULAR | Status: DC | PRN

## 2018-10-23 MED ORDER — OXYTOCIN 40 UNITS IN LACTATED RINGERS INFUSION - SIMPLE MED
1.0000 m[IU]/min | INTRAVENOUS | Status: DC
  Administered 2018-10-23: 2 m[IU]/min via INTRAVENOUS

## 2018-10-23 MED ORDER — TERBUTALINE SULFATE 1 MG/ML IJ SOLN
0.2500 mg | Freq: Once | INTRAMUSCULAR | Status: DC | PRN
  Filled 2018-10-23: qty 1

## 2018-10-23 MED ORDER — PHENYLEPHRINE 40 MCG/ML (10ML) SYRINGE FOR IV PUSH (FOR BLOOD PRESSURE SUPPORT)
80.0000 ug | PREFILLED_SYRINGE | INTRAVENOUS | Status: DC | PRN
  Filled 2018-10-23: qty 10

## 2018-10-23 MED ORDER — PHENYLEPHRINE 40 MCG/ML (10ML) SYRINGE FOR IV PUSH (FOR BLOOD PRESSURE SUPPORT)
80.0000 ug | PREFILLED_SYRINGE | INTRAVENOUS | Status: DC | PRN
  Filled 2018-10-23 (×2): qty 10

## 2018-10-23 NOTE — Progress Notes (Signed)
OB/GYN Faculty Practice: Labor Progress Note  Subjective: Doing well, has been sleeping since FB came out.   Objective: BP (!) 142/99   Pulse 95   Temp 98.3 F (36.8 C) (Oral)   Resp 16   Ht 5\' 2"  (1.575 m)   Wt 93.4 kg   SpO2 100%   BMI 37.66 kg/m  Gen: well-appearing, NAD Dilation: 4 Effacement (%): Thick Cervical Position: Posterior Station: -3 Presentation: Vertex Exam by:: Dr. Marlis EdelsonL Malick Netz  Assessment and Plan: 17 y.o. G1P0 129w6d here for IOL for severe preeclampsia (headaches, vision changes and 1 severe range BP) while initially presented to MAU for nosebleeds.   Labor: Induction started around noon 10/22/18 with cytotec. FB out, cervix is still posterior FB 4/thick/-3. Will give one additional dose of cytotec (9th dose, switching back to buccal) then plan to start pitocin at 1000.  -- pain control: plans for epidural -- PPH Risk: medium/high (expect prolonged labor course given Mg++)  Fetal Well-Being: EFW limited by body habitus, was 50% at anatomy U/S, likely 7-8lbs by Leopolds. Cephalic by sutures.  -- Category I - continuous fetal monitoring  -- GBS positive   Severe Preeclampsia (HA, vision changes, 1x severe BP):  BP well-controlled at this time, moderate range BP. Baseline UPC 0.33, HELLP labs wnl. -- continue to monitor Bps -- continue Mg++  Ben Sanz S. Earlene PlaterWallace, DO OB/GYN Fellow, Faculty Practice  6:31 AM

## 2018-10-23 NOTE — Progress Notes (Signed)
LABOR PROGRESS NOTE  Bonnita Nasutilejandra Garcia Ozuna is a 17 y.o. G1P0 at 9472w0d  admitted for IOL 2/2 severe Pre-e  Subjective: Patient is still feeling lower abdominal discomfort.   Objective: BP (!) 147/89   Pulse 92   Temp 97.6 F (36.4 C) (Oral)   Resp (P) 18   Ht 5\' 2"  (1.575 m)   Wt 93.4 kg   SpO2 98%   BMI 37.66 kg/m  or  Vitals:   10/23/18 1531 10/23/18 1535 10/23/18 1601 10/23/18 1631  BP: (!) 139/94 (!) 133/91 (!) 149/94 (!) 147/89  Pulse: 91 92 89 92  Resp: 20  18 (P) 18  Temp:      TempSrc:      SpO2:      Weight:      Height:        ~1640 Dilation: (P) 5.5 Effacement (%): (P) 80 Cervical Position: (P) Middle Station: (P) -2 Presentation: (P) Vertex Exam by:: (P) sowder FHT: baseline rate 125, moderate varibility, positive acel, early decel Toco: 3-4 minutes  Labs: Lab Results  Component Value Date   WBC 13.9 (H) 10/23/2018   HGB 12.7 10/23/2018   HCT 37.2 10/23/2018   MCV 92.3 10/23/2018   PLT 250 10/23/2018    Patient Active Problem List   Diagnosis Date Noted  . Preeclampsia, third trimester 10/21/2018    Assessment / Plan: 17 y.o. G1P0 at 5372w0d here for IOL 2/2 severe Pre-e  Labor: patient cervix is progressing. Pitocin 14. More clear amniotic fluid leakage. Since having good contractions on monitor and slow cervix change, elected to insert IUPC at this time for greater monitoring of contraction adequacy. Placed successfully with good contractions. Fetal Wellbeing:  Category I Pain Control:  Epidural in place Anticipated MOD:  vaginal  Aalani Aikens DO  10/23/2018, 4:47 PM

## 2018-10-23 NOTE — Anesthesia Procedure Notes (Signed)
Epidural Patient location during procedure: OB Start time: 10/23/2018 3:02 PM End time: 10/23/2018 3:10 PM  Staffing Anesthesiologist: Mal AmabileFoster, Oniya Mandarino, MD Performed: anesthesiologist   Preanesthetic Checklist Completed: patient identified, site marked, surgical consent, pre-op evaluation, timeout performed, IV checked, risks and benefits discussed and monitors and equipment checked  Epidural Patient position: sitting Prep: site prepped and draped and DuraPrep Patient monitoring: continuous pulse ox and blood pressure Approach: midline Location: L3-L4 Injection technique: LOR air  Needle:  Needle type: Tuohy  Needle gauge: 17 G Needle length: 9 cm and 9 Needle insertion depth: 5 cm cm Catheter type: closed end flexible Catheter size: 19 Gauge Catheter at skin depth: 10 cm Test dose: negative and Other  Assessment Events: blood not aspirated, injection not painful, no injection resistance, negative IV test and no paresthesia  Additional Notes Patient identified. Risks and benefits discussed including failed block, incomplete  Pain control, post dural puncture headache, nerve damage, paralysis, blood pressure Changes, nausea, vomiting, reactions to medications-both toxic and allergic and post Partum back pain. All questions were answered. Patient expressed understanding and wished to proceed. Sterile technique was used throughout procedure. Epidural site was Dressed with sterile barrier dressing. No paresthesias, signs of intravascular injection Or signs of intrathecal spread were encountered.  Patient was more comfortable after the epidural was dosed. Please see RN's note for documentation of vital signs and FHR which are stable. Reason for block:procedure for pain

## 2018-10-23 NOTE — Anesthesia Preprocedure Evaluation (Addendum)
Anesthesia Evaluation  Patient identified by MRN, date of birth, ID band Patient awake    Reviewed: Allergy & Precautions, Patient's Chart, lab work & pertinent test results  Airway Mallampati: II  TM Distance: >3 FB Neck ROM: Full    Dental no notable dental hx. (+) Teeth Intact   Pulmonary neg pulmonary ROS,    Pulmonary exam normal breath sounds clear to auscultation       Cardiovascular hypertension, Normal cardiovascular exam Rhythm:Regular Rate:Normal     Neuro/Psych negative neurological ROS  negative psych ROS   GI/Hepatic Neg liver ROS, GERD  ,  Endo/Other  Obesity  Renal/GU negative Renal ROS  negative genitourinary   Musculoskeletal negative musculoskeletal ROS (+)   Abdominal (+) + obese,   Peds  Hematology  (+) anemia ,   Anesthesia Other Findings   Reproductive/Obstetrics (+) Pregnancy Pre eclampsia- on MgSO4                             Anesthesia Physical Anesthesia Plan  ASA: III  Anesthesia Plan: Epidural   Post-op Pain Management:    Induction:   PONV Risk Score and Plan:   Airway Management Planned: Natural Airway  Additional Equipment:   Intra-op Plan:   Post-operative Plan:   Informed Consent: I have reviewed the patients History and Physical, chart, labs and discussed the procedure including the risks, benefits and alternatives for the proposed anesthesia with the patient or authorized representative who has indicated his/her understanding and acceptance.     Plan Discussed with: Anesthesiologist  Anesthesia Plan Comments:         Anesthesia Quick Evaluation

## 2018-10-23 NOTE — Progress Notes (Signed)
OB/GYN Faculty Practice: Labor Progress Note  Subjective: Strip note. Plan of care discussed with RN. FB is now out, feeling increasingly more uncomfortable with contractions.   Objective: BP 139/84   Pulse 99   Temp 98.5 F (36.9 C) (Oral)   Resp 16   Ht 5\' 2"  (1.575 m)   Wt 93.4 kg   SpO2 100%   BMI 37.66 kg/m  Gen: well-appearing, NAD Dilation: 4 Effacement (%): 30, 40 Cervical Position: Posterior Station: Ballotable Presentation: Vertex Exam by:: Gearldine Bienenstockiana Castillo, RN  Assessment and Plan: 17 y.o. G1P0 3735w6d here for IOL for severe preeclampsia (headaches, vision changes and 1 severe range BP) while initially presented to MAU for nosebleeds.   Labor: Induction started around noon 10/22/18 with cytotec. FB now out! Will give 8th dose of cytotec, still some thickness/firmness to cervix.  -- pain control: plans for epidural -- PPH Risk: medium/high (expect prolonged labor course given Mg++)  Fetal Well-Being: EFW limited by body habitus, was 50% at anatomy U/S, likely 7-8lbs by Leopolds. Cephalic by sutures.  -- Category I - continuous fetal monitoring  -- GBS positive   Severe Preeclampsia (HA, vision changes, 1x severe BP):  BP well-controlled at this time, moderate range BP. Baseline UPC 0.33, HELLP labs wnl. -- continue to monitor Bps -- continue Mg++  Mitali Shenefield S. Earlene PlaterWallace, DO OB/GYN Fellow, Faculty Practice  3:52 AM

## 2018-10-23 NOTE — Progress Notes (Signed)
LABOR PROGRESS NOTE  Allison Ferrell is a 17 y.o. G1P0 at 8024w0d  admitted for IOL 2/2 severe Pre-e  Subjective: Patient has increasing discomfort with contractions which she rates 4/10 at this time.   Objective: BP (!) 151/94   Pulse (!) 106   Temp 98.3 F (36.8 C) (Oral)   Resp 20   Ht 5\' 2"  (1.575 m)   Wt 93.4 kg   SpO2 100%   BMI 37.66 kg/m  or  Vitals:   10/23/18 0859 10/23/18 0901 10/23/18 1001 10/23/18 1007  BP:  (!) 144/93 (!) 157/111 (!) 151/94  Pulse:  95 (!) 105 (!) 106  Resp: 18  20   Temp:      TempSrc:      SpO2:      Weight:      Height:        ~1000 Dilation: 5 Effacement (%): Thick Cervical Position: Posterior Station: -3 Presentation: Vertex Exam by:: Momoko Slezak FHT: baseline rate 135, moderate varibility, positive acel, negative decel Toco: 2-4 minutes, mild  Labs: Lab Results  Component Value Date   WBC 10.7 (H) 10/23/2018   HGB 11.4 (L) 10/23/2018   HCT 34.0 (L) 10/23/2018   MCV 92.9 10/23/2018   PLT 226 10/23/2018    Patient Active Problem List   Diagnosis Date Noted  . Preeclampsia, third trimester 10/21/2018    Assessment / Plan: 17 y.o. G1P0 at 8124w0d here for IOL  Labor: progressing slowly cervix ~5/thick/posterior, -3 station. Vertex position. Starting pitocin at this time. BP elevated during this check but taken while patient was moving so will recheck. Fetal Wellbeing:  Category I Pain Control:  None at this time. IV fentanyl ordered Anticipated MOD:  vaginal  Jamelle Rushinghelsey Denaisha Swango 10/23/2018, 10:07 AM

## 2018-10-24 ENCOUNTER — Encounter (HOSPITAL_COMMUNITY): Payer: Self-pay

## 2018-10-24 LAB — CBC
HCT: 31.4 % — ABNORMAL LOW (ref 36.0–46.0)
Hemoglobin: 10.9 g/dL — ABNORMAL LOW (ref 12.0–15.0)
MCH: 31.7 pg (ref 26.0–34.0)
MCHC: 34.7 g/dL (ref 30.0–36.0)
MCV: 91.3 fL (ref 80.0–100.0)
Platelets: 222 10*3/uL (ref 150–400)
RBC: 3.44 MIL/uL — ABNORMAL LOW (ref 3.87–5.11)
RDW: 13.3 % (ref 11.5–15.5)
WBC: 17.8 10*3/uL — ABNORMAL HIGH (ref 4.0–10.5)
nRBC: 0 % (ref 0.0–0.2)

## 2018-10-24 MED ORDER — DIBUCAINE 1 % RE OINT: 1.0000 "application " | TOPICAL_OINTMENT | RECTAL | Status: DC | PRN

## 2018-10-24 MED ORDER — ONDANSETRON HCL 4 MG/2ML IJ SOLN: 4.0000 mg | INTRAMUSCULAR | Status: DC | PRN

## 2018-10-24 MED ORDER — PRENATAL MULTIVITAMIN CH
1.0000 | ORAL_TABLET | Freq: Every day | ORAL | Status: DC
  Administered 2018-10-24 – 2018-10-25 (×2): 1 via ORAL
  Filled 2018-10-24 (×2): qty 1

## 2018-10-24 MED ORDER — LACTATED RINGERS IV SOLN
INTRAVENOUS | Status: AC
  Administered 2018-10-24 (×2): via INTRAVENOUS

## 2018-10-24 MED ORDER — BENZOCAINE-MENTHOL 20-0.5 % EX AERO: 1.0000 "application " | INHALATION_SPRAY | CUTANEOUS | Status: DC | PRN

## 2018-10-24 MED ORDER — MAGNESIUM SULFATE 40 G IN LACTATED RINGERS - SIMPLE
2.0000 g/h | INTRAVENOUS | Status: AC
  Administered 2018-10-24: 2 g/h via INTRAVENOUS
  Filled 2018-10-24: qty 500

## 2018-10-24 MED ORDER — IBUPROFEN 600 MG PO TABS
600.0000 mg | ORAL_TABLET | Freq: Four times a day (QID) | ORAL | Status: DC
  Administered 2018-10-24 – 2018-10-25 (×7): 600 mg via ORAL
  Filled 2018-10-24 (×7): qty 1

## 2018-10-24 MED ORDER — WITCH HAZEL-GLYCERIN EX PADS: 1.0000 "application " | MEDICATED_PAD | CUTANEOUS | Status: DC | PRN

## 2018-10-24 MED ORDER — TETANUS-DIPHTH-ACELL PERTUSSIS 5-2.5-18.5 LF-MCG/0.5 IM SUSP: 0.5000 mL | Freq: Once | INTRAMUSCULAR | Status: DC

## 2018-10-24 MED ORDER — COCONUT OIL OIL
1.0000 "application " | TOPICAL_OIL | Status: DC | PRN
  Administered 2018-10-24: 1 via TOPICAL
  Filled 2018-10-24: qty 120

## 2018-10-24 MED ORDER — SIMETHICONE 80 MG PO CHEW: 80.0000 mg | CHEWABLE_TABLET | ORAL | Status: DC | PRN

## 2018-10-24 MED ORDER — ONDANSETRON HCL 4 MG PO TABS: 4.0000 mg | ORAL_TABLET | ORAL | Status: DC | PRN

## 2018-10-24 MED ORDER — SENNOSIDES-DOCUSATE SODIUM 8.6-50 MG PO TABS
2.0000 | ORAL_TABLET | ORAL | Status: DC
  Administered 2018-10-24 – 2018-10-25 (×2): 2 via ORAL
  Filled 2018-10-24 (×2): qty 2

## 2018-10-24 MED ORDER — ACETAMINOPHEN 325 MG PO TABS: 650.0000 mg | ORAL_TABLET | ORAL | Status: DC | PRN

## 2018-10-24 NOTE — Progress Notes (Signed)
CSW acknowledged consult and attempted to see MOB to complete assessment. MOB was asleep and infant was asleep. CSW will attempt to see MOB at a later time.   Celso SickleKimberly Haden Cavenaugh, LCSWA Clinical Social Worker [facility] Cell#: [phone]

## 2018-10-24 NOTE — Progress Notes (Signed)
CSW attempted to see MOB to complete assessment. MOB was receiving magnesium, CSW will see MOB once magnesium is discontinued.  Allison Ferrell, LCSWA Clinical Social Worker [facility] Cell#: [phone]

## 2018-10-24 NOTE — Progress Notes (Signed)
Daily Antepartum Note  Admission Date: 10/21/2018 Current Date: 10/24/2018 8:49 AM  Allison Ferrell is a 17 y.o. G1P1001 PPD#1 SVD/1st (repaired) @ [redacted]w[redacted]d. Patient admitted for IOL for pre-eclampsia with severe features (BP, protein)  Pregnancy complicated by: Patient Active Problem List   Diagnosis Date Noted  . Preeclampsia, third trimester 10/21/2018    Overnight/24hr events:  none  Subjective:  No s/s of pre-eclampsia. Normal lochia, minimal pain  Objective:    Current Vital Signs 24h Vital Sign Ranges  T 97.8 F (36.6 C) Temp  Avg: 98.6 F (37 C)  Min: 97.6 F (36.4 C)  Max: 100.2 F (37.9 C)  BP 134/80 BP  Min: 131/97  Max: 164/94  HR 84 Pulse  Avg: 97.9  Min: 84  Max: 121  RR 18 Resp  Avg: 18.7  Min: 16  Max: 20  SaO2 96 % Room Air SpO2  Avg: 98.4 %  Min: 96 %  Max: 100 %       24 Hour I/O Current Shift I/O  Time Ins Outs 12/18 0701 - 12/19 0700 In: 3609.3 [P.O.:1680; I.V.:1929.3] Out: 3789 [Urine:3250] 12/19 0701 - 12/19 1900 In: 1019.8 [P.O.:720; I.V.:299.8] Out: 700 [Urine:700]   Patient Vitals for the past 24 hrs:  BP Temp Temp src Pulse Resp SpO2  10/24/18 0800 - - - - 18 -  10/24/18 0425 134/80 97.8 F (36.6 C) Oral 84 18 96 %  10/24/18 0009 (!) 147/89 98.6 F (37 C) Oral (!) 109 20 100 %  10/23/18 2256 (!) 149/93 100.2 F (37.9 C) Oral (!) 105 20 100 %  10/23/18 2200 140/81 - - (!) 109 - -  10/23/18 2145 (!) 141/80 - - 96 - -  10/23/18 2130 (!) 131/97 99 F (37.2 C) Oral (!) 121 - -  10/23/18 2115 (!) 155/87 - - (!) 108 - -  10/23/18 2100 (!) 144/86 - - (!) 111 - -  10/23/18 2030 (!) 164/94 - - (!) 106 - -  10/23/18 2000 (!) 163/101 98.6 F (37 C) Oral (!) 104 - -  10/23/18 1931 (!) 155/100 - - 100 - -  10/23/18 1904 (!) 159/82 - - 95 18 -  10/23/18 1831 132/75 - - 87 20 -  10/23/18 1801 (!) 145/87 98.5 F (36.9 C) Oral 96 20 -  10/23/18 1731 (!) 148/92 - - 99 - -  10/23/18 1701 (!) 142/88 - - 95 18 -  10/23/18 1631 (!) 147/89 - -  92 18 -  10/23/18 1601 (!) 149/94 - - 89 18 -  10/23/18 1535 (!) 133/91 - - 92 - -  10/23/18 1531 (!) 139/94 - - 91 20 -  10/23/18 1530 - - - - - 98 %  10/23/18 1526 135/87 - - 90 - -  10/23/18 1521 139/87 - - 89 - 97 %  10/23/18 1520 - - - - - 97 %  10/23/18 1516 (!) 145/89 97.6 F (36.4 C) Oral 90 18 -  10/23/18 1515 - - - - - 99 %  10/23/18 1511 (!) 142/89 - - 98 - -  10/23/18 1510 - - - - - 100 %  10/23/18 1507 (!) 147/86 - - 92 - -  10/23/18 1502 - - - - 18 -  10/23/18 1448 (!) 151/90 - - 96 - -  10/23/18 1443 (!) 147/95 - - 98 20 -  10/23/18 1438 (!) 164/109 - - (!) 108 - -  10/23/18 1401 (!) 141/101 98.5 F (36.9 C)  Oral (!) 106 20 -  10/23/18 1350 (!) 154/95 - - 97 - -  10/23/18 1331 (!) 145/94 - - 91 - -  10/23/18 1301 (!) 147/99 - - 92 - -  10/23/18 1259 - - - - 18 -  10/23/18 1231 (!) 150/102 - - 94 - -  10/23/18 1201 (!) 141/108 - - 91 18 -  10/23/18 1131 (!) 146/96 - - 97 18 -  10/23/18 1101 139/84 - - 97 - -  10/23/18 1100 - - - - 16 -  10/23/18 1031 138/90 98.2 F (36.8 C) Oral 95 - -  10/23/18 1007 (!) 151/94 - - (!) 106 20 -  10/23/18 1001 (!) 157/111 - - (!) 105 20 -  10/23/18 0901 (!) 144/93 - - 95 - -  10/23/18 0859 - - - - 18 -  UOP: >19400mL/hr  Physical exam: General: Well nourished, well developed female in no acute distress. Abdomen: obese, nttp Cardiovascular: S1, S2 normal, no murmur, rub or gallop, regular rate and rhythm Respiratory: CTAB Extremities: no clubbing, cyanosis or edema Skin: Warm and dry.   Medications: Current Facility-Administered Medications  Medication Dose Route Frequency Provider Last Rate Last Dose  . acetaminophen (TYLENOL) tablet 650 mg  650 mg Oral Q4H PRN Wilson, Bridgid H, MD      . benzocaine-Menthol (DERMOPLAST) 20-0.5 % topical spray 1 application  1 application Topical PRN Wilson, Bridgid H, MD      . coconut oil  1 application Topical PRN Henderson NewcomerWilson, Bridgid H, MD      . witch hazel-glycerin (TUCKS) pad 1  application  1 application Topical PRN Wilson, Bridgid H, MD       And  . dibucaine (NUPERCAINAL) 1 % rectal ointment 1 application  1 application Rectal PRN Henderson NewcomerWilson, Bridgid H, MD      . ibuprofen (ADVIL,MOTRIN) tablet 600 mg  600 mg Oral Q6H Wilson, Bridgid H, MD   600 mg at 10/24/18 0606  . lactated ringers infusion   Intravenous Continuous Kathrynn RunningWouk, Noah Bedford, MD 75 mL/hr at 10/24/18 [phone]    . lidocaine (PF) (XYLOCAINE) 1 % injection 30 mL  30 mL Subcutaneous PRN Judeth HornLawrence, Erin, NP   30 mL at 10/23/18 2055  . magnesium sulfate 40 grams in LR 500 mL OB infusion  2 g/hr Intravenous Continuous Lazaro ArmsEure, Luther H, MD      . ondansetron [facility](ZOFRAN) tablet 4 mg  4 mg Oral Q4H PRN Henderson NewcomerWilson, Bridgid H, MD       Or  . ondansetron (ZOFRAN) injection 4 mg  4 mg Intravenous Q4H PRN Andrey CampanileWilson, Bridgid H, MD      . prenatal multivitamin tablet 1 tablet  1 tablet Oral Q1200 Wilson, Bridgid H, MD      . senna-docusate (Senokot-S) tablet 2 tablet  2 tablet Oral Q24H Henderson NewcomerWilson, Bridgid H, MD   2 tablet at 10/24/18 0125  . simethicone (MYLICON) chewable tablet 80 mg  80 mg Oral PRN Andrey CampanileWilson, Bridgid H, MD      . Tdap (BOOSTRIX) injection 0.5 mL  0.5 mL Intramuscular Once Henderson NewcomerWilson, Bridgid H, MD        Labs:  Recent Labs  Lab 10/23/18 1440 10/23/18 2207 10/24/18 0549  WBC 13.9* 18.7* 17.8*  HGB 12.7 12.0 10.9*  HCT 37.2 35.1* 31.4*  PLT 250 219 222    Recent Labs  Lab 10/21/18 1829 10/23/18 0644  NA 134* 133*  K 3.7 3.4*  CL 107 104  CO2 21* 20*  BUN  10 <5*  CREATININE 0.56 0.53  CALCIUM 8.6* 7.0*  PROT 6.0* 5.8*  BILITOT 0.6 1.0  ALKPHOS 117 108  ALT 28 25  AST 31 26  GLUCOSE 90 89    Radiology: no new imaging  Assessment & Plan:  Pt stable  *PP: routine care. Pt unsure of BC. I d/w her re: [place] program and could get nexplanon prior to d/c. Pt to consider. O pos, breast.  *Severe pre-eclampsia: follow BPs. May need to start PO meds *PPx: SCDs, bedrest with bathroom priv. *FEN/GI: regular  diet. MIVF, Mg x 24h *Dispo: likely tomorrow  Cornelia Copaharlie Selena Swaminathan, Jr. MD Attending Center for [facility](Faculty Practice)

## 2018-10-24 NOTE — Lactation Note (Signed)
This note was copied from a baby's chart. Lactation Consultation Note  Patient Name: Boy Bonnita Nasutilejandra Garcia Ozuna ZOXWR'UToday's Date: 10/24/2018 Reason for consult: Initial assessment;Early term 37-38.6wks;Primapara;1st time breastfeeding  P1 mother whose infant is now 3015 hours old.    Mother feels like breast feeding is going well so far, however, she stated that she has not fed on her left breast because of nipple pain.  Her breasts are soft and non tender and her nipples are everted.  She does have 2 small bumps around her nipple on the left side but no breakdown or redness noted.  RN will bring in coconut oil for comfort.  Encouraged her to feed 8-12 times/24 hours or sooner if baby shows feeding cues.  Reviewed feeding cues with mother.  Taught hand expression and mother did a return demonstration with a few drops of colostrum from the right breast which I finger fed back to baby.  Suggested mother continue to do hand expression before/after latching to help increase milk supply.  Explained the importance of putting baby to left breast and/or hand expression so this will facilitate increasing milk supply; leaving the breast idle will diminish the supply which mother is now aware of.  I asked her to call for latch assistance the next time baby is ready to feed so I may help assess why the left nipple hurts when feeding.  She has been using the cradle hold and I did caution her about possibly trying a different position when she calls me back.    Mom made aware of O/P services, breastfeeding support groups, community resources, and our phone # for post-discharge questions.  Mother does not have a DEBP for home use but [facility].  Referral made.   Maternal Data Formula Feeding for Exclusion: No Has patient been taught Hand Expression?: Yes Does the patient have breastfeeding experience prior to this delivery?: No  Feeding Feeding Type: Breast Fed  LATCH  Score                   Interventions    Lactation Tools Discussed/Used WIC Program: Yes   Consult Status Consult Status: Follow-up Date: 10/25/18 Follow-up type: In-patient    Corene Resnick R Jeffie Widdowson 10/24/2018, 11:54 AM

## 2018-10-24 NOTE — Anesthesia Postprocedure Evaluation (Signed)
Anesthesia Post Note  Patient: Allison Ferrell  Procedure(s) Performed: AN AD HOC LABOR EPIDURAL     Patient location during evaluation: Mother Baby Anesthesia Type: Epidural Level of consciousness: awake and alert Pain management: pain level controlled Vital Signs Assessment: post-procedure vital signs reviewed and stable Respiratory status: spontaneous breathing, nonlabored ventilation and respiratory function stable Cardiovascular status: stable Postop Assessment: no headache, no backache and epidural receding Anesthetic complications: no    Last Vitals:  Vitals:   10/24/18 0009 10/24/18 0425  BP: (!) 147/89 134/80  Pulse: (!) 109 84  Resp: 20 18  Temp: 37 C 36.6 C  SpO2: 100% 96%    Last Pain:  Vitals:   10/24/18 0425  TempSrc: Oral  PainSc:    Pain Goal:                 [name]

## 2018-10-25 MED ORDER — NIFEDIPINE ER OSMOTIC RELEASE 30 MG PO TB24: 30.0000 mg | ORAL_TABLET | Freq: Every day | ORAL | Status: DC

## 2018-10-25 MED ORDER — IBUPROFEN 600 MG PO TABS: 600.0000 mg | ORAL_TABLET | Freq: Four times a day (QID) | ORAL | 0 refills | Status: DC | PRN

## 2018-10-25 MED ORDER — PRENATAL MULTIVITAMIN CH: 1.0000 | ORAL_TABLET | Freq: Every day | ORAL | Status: DC

## 2018-10-25 NOTE — Discharge Instructions (Signed)
Vaginal Delivery, Care After Refer to this sheet in the next few weeks. These discharge instructions provide you with information on caring for yourself after delivery. Your caregiver may also give you specific instructions. Your treatment has been planned according to the most current medical practices available, but problems sometimes occur. Call your caregiver if you have any problems or questions after you go home. HOME CARE INSTRUCTIONS 1. Take over-the-counter or prescription medicines only as directed by your caregiver or pharmacist. 2. Do not drink alcohol, especially if you are breastfeeding or taking medicine to relieve pain. 3. Do not smoke tobacco. 4. Continue to use good perineal care. Good perineal care includes: 1. Wiping your perineum from back to front 2. Keeping your perineum clean. 3. You can do sitz baths twice a day, to help keep this area clean 5. Do not use tampons, douche or have sex until your caregiver says it is okay. 6. Shower only and avoid sitting in submerged water, aside from sitz baths 7. Wear a well-fitting bra that provides breast support. 8. Eat healthy foods. 9. Drink enough fluids to keep your urine clear or pale yellow. 10. Eat high-fiber foods such as whole grain cereals and breads, brown rice, beans, and fresh fruits and vegetables every day. These foods may help prevent or relieve constipation. 11. Avoid constipation with high fiber foods or medications, such as miralax or metamucil 12. Follow your caregiver's recommendations regarding resumption of activities such as climbing stairs, driving, lifting, exercising, or traveling. 13. Talk to your caregiver about resuming sexual activities. Resumption of sexual activities is dependent upon your risk of infection, your rate of healing, and your comfort and desire to resume sexual activity. 14. Try to have someone help you with your household activities and your newborn for at least a few days after you leave  the hospital. 15. Rest as much as possible. Try to rest or take a nap when your newborn is sleeping. 16. Increase your activities gradually. 17. Keep all of your scheduled postpartum appointments. It is very important to keep your scheduled follow-up appointments. At these appointments, your caregiver will be checking to make sure that you are healing physically and emotionally. SEEK MEDICAL CARE IF:   You are passing large clots from your vagina. Save any clots to show your caregiver.  You have a foul smelling discharge from your vagina.  You have trouble urinating.  You are urinating frequently.  You have pain when you urinate.  You have a change in your bowel movements.  You have increasing redness, pain, or swelling near your vaginal incision (episiotomy) or vaginal tear.  You have pus draining from your episiotomy or vaginal tear.  Your episiotomy or vaginal tear is separating.  You have painful, hard, or reddened breasts.  You have a severe headache.  You have blurred vision or see spots.  You feel sad or depressed.  You have thoughts of hurting yourself or your newborn.  You have questions about your care, the care of your newborn, or medicines.  You are dizzy or light-headed.  You have a rash.  You have nausea or vomiting.  You were breastfeeding and have not had a menstrual period within 12 weeks after you stopped breastfeeding.  You are not breastfeeding and have not had a menstrual period by the 12th week after delivery.  You have a fever. SEEK IMMEDIATE MEDICAL CARE IF:   You have persistent pain.  You have chest pain.  You have shortness of breath.    You faint.  You have leg pain.  You have stomach pain.  Your vaginal bleeding saturates two or more sanitary pads in 1 hour. MAKE SURE YOU:   Understand these instructions.  Will watch your condition.  Will get help right away if you are not doing well or get worse. Document Released:  10/20/2000 Document Revised: 03/09/2014 Document Reviewed: 06/19/2012 [facility] Patient Information 2015 [employer], [place]. This information is not intended to replace advice given to you by your health care provider. Make sure you discuss any questions you have with your health care provider.  Sitz Bath A sitz bath is a warm water bath taken in the sitting position. The water covers only the hips and butt (buttocks). We recommend using one that fits in the toilet, to help with ease of use and cleanliness. It may be used for either healing or cleaning purposes. Sitz baths are also used to relieve pain, itching, or muscle tightening (spasms). The water may contain medicine. Moist heat will help you heal and relax.  HOME CARE  Take 3 to 4 sitz baths a day. 18. Fill the bathtub half-full with warm water. 19. Sit in the water and open the drain a little. 20. Turn on the warm water to keep the tub half-full. Keep the water running constantly. 21. Soak in the water for 15 to 20 minutes. 22. After the sitz bath, pat the affected area dry. GET HELP RIGHT AWAY IF: You get worse instead of better. Stop the sitz baths if you get worse. MAKE SURE YOU:  Understand these instructions.  Will watch your condition.  Will get help right away if you are not doing well or get worse. Document Released: 11/30/2004 Document Revised: 07/17/2012 Document Reviewed: 02/20/2011 [facility] Patient Information 2015 [place], [place]. This information is not intended to replace advice given to you by your health care provider. Make sure you discuss any questions you have with your health care provider   Preeclampsia   Preeclampsia is a serious condition that may develop during pregnancy. It is also called toxemia of pregnancy. This condition causes high blood pressure along with other symptoms, such as swelling and headaches. These symptoms may develop as the condition gets worse. Preeclampsia may occur at 20 weeks of  pregnancy or later. Diagnosing and treating preeclampsia early is very important. If not treated early, it can cause serious problems for you and your baby. One problem it can lead to is eclampsia. Eclampsia is a condition that causes muscle jerking or shaking (convulsions or seizures) and other serious problems for the mother. During pregnancy, delivering your baby may be the best treatment for preeclampsia or eclampsia. For most women, preeclampsia and eclampsia symptoms go away after giving birth. In rare cases, a woman may develop preeclampsia after giving birth (postpartum preeclampsia). This usually occurs within 48 hours after childbirth but may occur up to 6 weeks after giving birth. What are the causes? The cause of preeclampsia is not known. What increases the risk? The following risk factors make you more likely to develop preeclampsia:  Being pregnant for the first time.  Having had preeclampsia during a past pregnancy.  Having a family history of preeclampsia.  Having high blood pressure.  Being pregnant with more than one baby.  Being 5335 or older.  Being African-American.  Having kidney disease or diabetes.  Having medical conditions such as lupus or blood diseases.  Being very overweight (obese). What are the signs or symptoms? The earliest signs of preeclampsia are:  High blood pressure.  Increased protein in your urine. Your health care provider will check for this at every visit before you give birth (prenatal visit). Other symptoms that may develop as the condition gets worse include:  Severe headaches.  Sudden weight gain.  Swelling of the hands, face, legs, and feet.  Nausea and vomiting.  Vision problems, such as blurred or double vision.  Numbness in the face, arms, legs, and feet.  Urinating less than usual.  Dizziness.  Slurred speech.  Abdominal pain, especially upper abdominal pain. Convulsions or seizures.  Follow these instructions  at home: Eating and drinking  Drink enough fluid to keep your urine pale yellow.  Avoid caffeine. Lifestyle  Do not use any products that contain nicotine or tobacco, such as cigarettes and e-cigarettes. If you need help quitting, ask your health care provider.  Do not use alcohol or drugs.  Avoid stress as much as possible. Rest and get plenty of sleep. General instructions  Take over-the-counter and prescription medicines only as told by your health care provider.  When lying down, lie on your left side. This keeps pressure off your major blood vessels.  When sitting or lying down, raise (elevate) your feet. Try putting some pillows underneath your lower legs.  Exercise regularly. Ask your health care provider what kinds of exercise are best for you.  Keep all follow-up and prenatal visits as told by your health care provider. This is important. How is this prevented? There is no known way of preventing preeclampsia or eclampsia from developing. However, to lower your risk of complications and detect problems early:  Get regular prenatal care. Your health care provider may be able to diagnose and treat the condition early.  Maintain a healthy weight. Ask your health care provider for help managing weight gain during pregnancy.  Work with your health care provider to manage any long-term (chronic) health conditions you have, such as diabetes or kidney problems.  You may have tests of your blood pressure and kidney function after giving birth.  Your health care provider may have you take low-dose aspirin during your next pregnancy. Contact a health care provider if:  You have symptoms that your health care provider told you may require more treatment or monitoring, such as: ? Headaches. ? Nausea or vomiting. ? Abdominal pain. ? Dizziness. ? Light-headedness. Get help right away if:  You have severe: ? Abdominal pain. ? Headaches that do not get  better. ? Dizziness. ? Vision problems. ? Confusion. ? Nausea or vomiting.  You have any of the following: ? A seizure. ? Sudden, rapid weight gain. ? Sudden swelling in your hands, ankles, or face. ? Trouble moving any part of your body. ? Numbness in any part of your body. ? Trouble speaking. ? Abnormal bleeding.  You faint. Summary  Preeclampsia is a serious condition that may develop during pregnancy. It is also called toxemia of pregnancy.  This condition causes high blood pressure along with other symptoms, such as swelling and headaches.  Diagnosing and treating preeclampsia early is very important. If not treated early, it can cause serious problems for you and your baby.  Get help right away if you have symptoms that your health care provider told you to watch for. This information is not intended to replace advice given to you by your health care provider. Make sure you discuss any questions you have with your health care provider. Document Released: 10/20/2000 Document Revised: 10/09/2017 Document Reviewed: 05/29/2016 Elsevier Interactive Patient  Education  2019 [employer]

## 2018-10-25 NOTE — Progress Notes (Signed)
Pt ambulated out teaching complete  

## 2018-10-25 NOTE — Clinical Social Work Maternal (Addendum)
CLINICAL SOCIAL WORK MATERNAL/CHILD NOTE  Patient Details  Name: Allison Ferrell MRN: [id number] Date of Birth: November 03, 2001  Date:  10/25/2018  Clinical Social Worker Initiating Note:  Celso SickleKimberly Alyzabeth Pontillo, [place]CSWA Date/Time: Initiated:  10/25/18/0913     Child's Name:  Allison ClarityEliel Allison [facility]   Biological Parents:  Mother, Father(Father - Leisa LenzJose Ferrell)   Need for Interpreter:  None   Reason for Referral:  Late or No Prenatal Care (Started prenatal care at 2929 weeks)   [place]  [address] [place] [address]    Phone number:  [phone] (home)     Additional phone number:   Household Members/Support Persons (HM/SP):   Household Member/Support Person 1, Household Member/Support Person 2, Household Member/Support Person 3, Household Member/Support Person 4   HM/SP Name Relationship DOB or Age  HM/SP -1   Mom    HM/SP -2   Dad    HM/SP -3 Allison Ferrell brother  6/3  HM/SP -4 Allison Ferrell brother [id number]  HM/SP -5        HM/SP -6        HM/SP -7        HM/SP -8          Natural Supports (not living in the home):  Parent   Professional Supports: None   Employment: Unemployed   Type of Work:     Education:  Engineer, agriculturalHigh school graduate   Homebound arranged:    Surveyor, quantityinancial Resources:  Self-Pay    Other Resources:  [facility]   Cultural/Religious Considerations Which May Impact Care:    Strengths:  Ability to meet basic needs    Psychotropic Medications:         Pediatrician:       [place]   [place]    [place]    [place]    [place] [facility]    [place]    [place]      Pediatrician Fax Number:    Risk Factors/Current Problems:      Cognitive State:  Able to Concentrate , Alert , Linear Thinking    Mood/Affect:  Calm , Interested , Comfortable    CSW Assessment: CSW spoke with MOB at bedside regarding consult for late prenatal care. MOB was welcoming and engaged during assessment. MOB  appeared to be bonded with infant, CSW observed MOB holding infant throughout assessment.   CSW and MOB discussed late prenatal care. MOB reported that she started prenatal care late because she was not aware that she needed to go to all her appointments. MOB reported that her sister explained how important it was to go to all her visits to see how the baby is doing and she started going. MOB reported that FOB is involved and that she resides with her parents and two younger siblings. MOB reported that she has everything she needs for the baby except the crib. CSW informed MOB about the baby box, MOB agreeable to receiving a baby box so infant can have a safe place to sleep. CSW inquired about MOB's mental health history, MOB denied any mental health history. MOB presented calm and did not demonstrate any mental health signs/symptoms. CSW assessed for safety, MOB denied SI, HI and domestic violence.   CSW provided education regarding the baby blues period vs. perinatal mood disorders, discussed treatment and gave resources for mental health follow up if concerns arise.  CSW recommends self-evaluation during the postpartum time period using the New Mom Checklist from Postpartum Progress and encouraged  MOB to contact a medical professional if symptoms are noted at any time.   CSW provided review of Sudden Infant Death Syndrome (SIDS) precautions.   CSW explained hospital drug policy due to late prenatal care, MOB verbalized understanding. CSW asked MOB if she used any substances doing her pregnancy, MOB denied any substance use and reported that she only took prenatal vitamins during her pregnancy.   CSW notified MOB's bedside RN that MOB needed baby box prior to discharge.   CSW identifies no further need for intervention and no barriers to discharge at this time.  CSW Plan/Description:  No Further Intervention Required/No Barriers to Discharge, Sudden Infant Death Syndrome (SIDS) Education, Hospital  Drug Screen Policy Information, CSW Will Continue to Monitor Umbilical Cord Tissue Drug Screen Results and Make Report if Warranted, Perinatal Mood and Anxiety Disorder (PMADs) Education    Antionette PolesKimberly L Jenniah Bhavsar, LCSW 10/25/2018, 9:18 AM

## 2018-10-25 NOTE — Lactation Note (Signed)
This note was copied from a baby's chart. Lactation Consultation Note  Patient Name: Allison Ferrell ZOXWR'UToday's Date: 10/25/2018 Reason for consult: Follow-up assessment   P1, Baby 36 hours old.  Reviewed hand expression w/ good flow of colostrum. Baby has been having short feedings.  Encouraged mother to undress baby for feedings to wake him. Had mother compress breast during feeding to keep baby active.  Sucks and swallows observed. Feed on demand approximately 8-12 times per day.   Reviewed engorgement care and monitoring voids/stools. Mother has manual pump for discharge.    Maternal Data Has patient been taught Hand Expression?: Yes  Feeding Feeding Type: Breast Fed  LATCH Score Latch: Grasps breast easily, tongue down, lips flanged, rhythmical sucking.  Audible Swallowing: A few with stimulation  Type of Nipple: Everted at rest and after stimulation  Comfort (Breast/Nipple): Soft / non-tender  Hold (Positioning): Assistance needed to correctly position infant at breast and maintain latch.  LATCH Score: 8  Interventions Interventions: Breast feeding basics reviewed;Assisted with latch;Skin to skin;Hand express;Breast compression;Adjust position;Support pillows;Hand pump  Lactation Tools Discussed/Used     Consult Status Consult Status: Complete Date: 10/25/18    Allison Ferrell, Allison Ferrell [facility] 10/25/2018, 9:41 AM

## 2018-10-25 NOTE — Discharge Summary (Signed)
Obstetrical Discharge Summary  Date of Admission: 10/21/2018 Date of Discharge: 10/25/2018  [facility]  Gestational Age at Delivery: [id number]   Antepartum complications: none Reason for Admission: IOL for pre-eclampsia for severe features Date of Delivery: 10/23/2018  Delivered By: Cam HaiKimberly Shaw, CNM Delivery Type: spontaneous vaginal delivery Intrapartum complications/course: None Anesthesia: epidural Placenta: Delivered and expressed via active management. Intact: yes. To pathology: no.  Laceration: 1st degree (repaired) Episiotomy: none EBL: 539mL Baby: Liveborn female, APGARs 8/9, weight 3240 g.    Discharge Diagnosis: Delivered.  Postpartum course: Uncomplicated. She received 24 hours of PP Mg and did well on no medications for her BP. [facility] and patient unsure of birth control method  Discharge Vital Signs:  Current Vital Signs 24h Vital Sign Ranges  T 97.7 F (36.5 C) Temp  Avg: 97.7 F (36.5 C)  Min: 97.4 F (36.3 C)  Max: 98 F (36.7 C)  BP 123/83 BP  Min: 113/77  Max: 140/95  HR 86 Pulse  Avg: 85.5  Min: 70  Max: 98  RR 18 Resp  Avg: 17.9  Min: 17  Max: 18  SaO2 100 % Room Air SpO2  Avg: 99.8 %  Min: 99 %  Max: 100 %       24 Hour I/O Current Shift I/O  Time Ins Outs 12/19 0701 - 12/20 0700 In: 6034.6 [P.O.:4680; I.V.:1354.6] Out: 3350 [Urine:3350] No intake/output data recorded.    Patient Vitals for the past 6 hrs:  BP Temp Temp src Pulse Resp SpO2  10/25/18 0828 123/83 97.7 F (36.5 C) Oral 86 18 100 %    Discharge Exam:  NAD Perineum: deferred Abdomen: firm fundus below the umbilicus, NTTP, non distended, +bowel sounds.  RRR no MRGs CTAB Ext: no c/c/e  Recent Labs  Lab 10/23/18 1440 10/23/18 2207 10/24/18 0549  WBC 13.9* 18.7* 17.8*  HGB 12.7 12.0 10.9*  HCT 37.2 35.1* 31.4*  PLT 250 219 222    Disposition: Home  Rh Immune globulin given: not applicable Rubella  vaccine given: not applicable Tdap vaccine given in AP or PP setting: yes Flu vaccine given in AP or PP setting: yes  Contraception: unsure  Prenatal/Postnatal Panel: Conflict (See Lab Report): O POS/O POS [facility], [address] [place], [place] [address] Ishmael Holter//Rubella Immune//Varicella Immune//RPR negative//HIV negative/HepB Surface Ag negative//pap none (too young)//plans to breastfeed  Plan:  Bonnita Nasutilejandra Garcia Ozuna was discharged to home in good condition. Follow-up appointment with [facility] in 1 week for a BP visit  No future appointments.  Discharge Medications: Allergies as of 10/25/2018   No Known Allergies     Medication List    TAKE these medications   acetaminophen 325 MG tablet Commonly known as:  TYLENOL Take 325 mg by mouth every 6 (six) hours as needed for moderate pain.   ibuprofen 600 MG tablet Commonly known as:  ADVIL,MOTRIN Take 1 tablet (600 mg total) by mouth every 6 (six) hours as needed.   prenatal multivitamin Tabs tablet Take 1 tablet by mouth daily at 12 noon.       Cornelia Copaharlie Leniya Breit, Jr. MD Attending Center for [facility](Faculty Practice)

## 2018-10-28 ENCOUNTER — Telehealth: Payer: Self-pay | Admitting: Family Medicine

## 2018-10-28 NOTE — Telephone Encounter (Signed)
Called the patient to inform of future appointment// no answer// left message.

## 2018-11-01 ENCOUNTER — Ambulatory Visit: Payer: Self-pay

## 2019-09-22 ENCOUNTER — Other Ambulatory Visit: Payer: Self-pay

## 2019-09-22 DIAGNOSIS — Z20822 Contact with and (suspected) exposure to covid-19: Secondary | ICD-10-CM

## 2019-09-24 LAB — NOVEL CORONAVIRUS, NAA: SARS-CoV-2, NAA: NOT DETECTED

## 2020-03-17 ENCOUNTER — Other Ambulatory Visit: Payer: Self-pay

## 2020-03-17 ENCOUNTER — Encounter (HOSPITAL_COMMUNITY): Payer: Self-pay | Admitting: Emergency Medicine

## 2020-03-17 DIAGNOSIS — N644 Mastodynia: Secondary | ICD-10-CM | POA: Insufficient documentation

## 2020-03-17 DIAGNOSIS — M545 Low back pain: Secondary | ICD-10-CM | POA: Insufficient documentation

## 2020-03-17 DIAGNOSIS — R110 Nausea: Secondary | ICD-10-CM | POA: Insufficient documentation

## 2020-03-17 NOTE — ED Triage Notes (Signed)
Pt c/o bilateral breast pain left more than right onset x1 week l, Pt states currently breast feeding 3 month old

## 2020-03-18 ENCOUNTER — Emergency Department (HOSPITAL_COMMUNITY)
Admission: EM | Admit: 2020-03-18 | Discharge: 2020-03-18 | Disposition: A | Discharge status: Home / Self Care | Payer: Self-pay | Attending: Emergency Medicine | Admitting: Emergency Medicine

## 2020-03-18 DIAGNOSIS — R102 Pelvic and perineal pain: Secondary | ICD-10-CM

## 2020-03-18 DIAGNOSIS — N644 Mastodynia: Secondary | ICD-10-CM

## 2020-03-18 LAB — URINALYSIS, ROUTINE W REFLEX MICROSCOPIC
Bilirubin Urine: NEGATIVE
Glucose, UA: NEGATIVE mg/dL
Hgb urine dipstick: NEGATIVE
Ketones, ur: NEGATIVE mg/dL
Nitrite: NEGATIVE
Protein, ur: 30 mg/dL — AB
Specific Gravity, Urine: 1.024 (ref 1.005–1.030)
pH: 5 (ref 5.0–8.0)

## 2020-03-18 LAB — PREGNANCY, URINE: Preg Test, Ur: NEGATIVE

## 2020-03-18 MED ORDER — IBUPROFEN 600 MG PO TABS: 600.0000 mg | ORAL_TABLET | Freq: Four times a day (QID) | ORAL | 0 refills | Status: DC | PRN

## 2020-03-18 NOTE — ED Provider Notes (Signed)
[facility]-EMERGENCY DEPT Provider Note: Allison Dell, MD, FACEP  CSN: [id number] MRN: [id number] ARRIVAL: 03/17/20 at 2046 ROOM: WA14/WA14   CHIEF COMPLAINT  Breast Pain   HISTORY OF PRESENT ILLNESS  03/18/20 3:37 AM Allison Ferrell is a 19 y.o. female who gave birth 10/23/2018.  She is still breast-feeding.  She is here complaining of suprapubic and low back pain that is been present for 6 days.  She is also complaining of pain in her breasts, left greater than right.  She rates the pain as a 5 out of 10, worse with palpation or movement.  She denies any associated swelling, masses or erythema.  She has had no fever.  She has had nausea but no vomiting.  She has not taken anything for her symptoms.   Past Medical History:  Diagnosis Date  . Anemia   . Chlamydia     History reviewed. No pertinent surgical history.  History reviewed. No pertinent family history.  Social History   Tobacco Use  . Smoking status: Never Smoker  . Smokeless tobacco: Never Used  Substance Use Topics  . Alcohol use: Never  . Drug use: Never    Prior to Admission medications   Medication Sig Start Date End Date Taking? Authorizing Provider  ibuprofen (ADVIL) 600 MG tablet Take 1 tablet (600 mg total) by mouth every 6 (six) hours as needed (for pain). 03/18/20   Mahmood Boehringer, Jonny Ruiz, MD    Allergies Patient has no known allergies.   REVIEW OF SYSTEMS  Negative except as noted here or in the History of Present Illness.   PHYSICAL EXAMINATION  Initial Vital Signs Blood pressure 97/83, pulse 71, temperature 98.4 F (36.9 C), temperature source Oral, resp. rate 16, SpO2 100 %, unknown if currently breastfeeding.  Examination General: Well-developed, well-nourished female in no acute distress; appearance consistent with age of record HENT: normocephalic; atraumatic Eyes: Normal appearance Neck: supple Heart: regular rate and rhythm Lungs: clear to auscultation bilaterally Breasts: Symmetric  without erythema, mass, warmth or nipple discharge Abdomen: soft; nondistended; nontender; bowel sounds present Extremities: No deformity; full range of motion Neurologic: Awake, alert and oriented; motor function intact in all extremities and symmetric; no facial droop Skin: Warm and dry Psychiatric: Normal mood and affect   RESULTS  Summary of this visit's results, reviewed and interpreted by myself:   EKG Interpretation  Date/Time:    Ventricular Rate:    PR Interval:    QRS Duration:   QT Interval:    QTC Calculation:   R Axis:     Text Interpretation:        Laboratory Studies: Results for orders placed or performed during the hospital encounter of 03/18/20 (from the past 24 hour(s))  Urinalysis, Routine w reflex microscopic     Status: Abnormal   Collection Time: 03/18/20  3:46 AM  Result Value Ref Range   Color, Urine YELLOW YELLOW   APPearance HAZY (A) CLEAR   Specific Gravity, Urine 1.024 1.005 - 1.030   pH 5.0 5.0 - 8.0   Glucose, UA NEGATIVE NEGATIVE mg/dL   Hgb urine dipstick NEGATIVE NEGATIVE   Bilirubin Urine NEGATIVE NEGATIVE   Ketones, ur NEGATIVE NEGATIVE mg/dL   Protein, ur 30 (A) NEGATIVE mg/dL   Nitrite NEGATIVE NEGATIVE   Leukocytes,Ua SMALL (A) NEGATIVE   RBC / HPF 0-5 0 - 5 RBC/hpf   WBC, UA 6-10 0 - 5 WBC/hpf   Bacteria, UA RARE (A) NONE SEEN   Squamous Epithelial / LPF 11-20  0 - 5   Mucus PRESENT   Pregnancy, urine     Status: None   Collection Time: 03/18/20  3:46 AM  Result Value Ref Range   Preg Test, Ur NEGATIVE NEGATIVE   Imaging Studies: No results found.  ED COURSE and MDM  Nursing notes, initial and subsequent vitals signs, including pulse oximetry, reviewed and interpreted by myself.  Vitals:   03/17/20 2152 03/18/20 0212  BP: 135/74 97/83  Pulse: 87 71  Resp: 18 16  Temp: 98.4 F (36.9 C)   TempSrc: Oral   SpO2: 100% 100%   Medications - No data to display  Patient's urinalysis is equivocal for urinary tract  infection given the small number of white blood cells and large number of epithelial cells.  It has been sent for culture.  The cause of her suprapubic and back pain could be due to a urinary tract infection but again this is not confirmed by urinalysis.  She is nontender on exam.  Her breast exam is normal without evidence of mastitis or breast abscess.  She was advised to take Tylenol for pain.  Given that her nursing child is greater than a-year-old it is also safe for her to take ibuprofen.  PROCEDURES  Procedures   ED DIAGNOSES     ICD-10-CM   1. Mastalgia in female  N64.4   2. Suprapubic pain  R10.2        Shanon Rosser, MD 03/18/20 [phone]

## 2020-03-19 ENCOUNTER — Other Ambulatory Visit: Payer: Self-pay

## 2020-03-19 ENCOUNTER — Encounter (HOSPITAL_COMMUNITY): Payer: Self-pay

## 2020-03-19 ENCOUNTER — Emergency Department (HOSPITAL_COMMUNITY)
Admission: EM | Admit: 2020-03-19 | Discharge: 2020-03-19 | Disposition: A | Discharge status: Home / Self Care | Payer: Self-pay | Attending: Emergency Medicine | Admitting: Emergency Medicine

## 2020-03-19 DIAGNOSIS — R197 Diarrhea, unspecified: Secondary | ICD-10-CM | POA: Insufficient documentation

## 2020-03-19 DIAGNOSIS — R109 Unspecified abdominal pain: Secondary | ICD-10-CM | POA: Insufficient documentation

## 2020-03-19 DIAGNOSIS — N644 Mastodynia: Secondary | ICD-10-CM | POA: Insufficient documentation

## 2020-03-19 LAB — URINALYSIS, ROUTINE W REFLEX MICROSCOPIC
Bilirubin Urine: NEGATIVE
Glucose, UA: NEGATIVE mg/dL
Hgb urine dipstick: NEGATIVE
Ketones, ur: NEGATIVE mg/dL
Leukocytes,Ua: NEGATIVE
Nitrite: NEGATIVE
Protein, ur: NEGATIVE mg/dL
Specific Gravity, Urine: 1.028 (ref 1.005–1.030)
pH: 5 (ref 5.0–8.0)

## 2020-03-19 LAB — CBC
HCT: 36.2 % (ref 36.0–46.0)
Hemoglobin: 11.7 g/dL — ABNORMAL LOW (ref 12.0–15.0)
MCH: 29.5 pg (ref 26.0–34.0)
MCHC: 32.3 g/dL (ref 30.0–36.0)
MCV: 91.2 fL (ref 80.0–100.0)
Platelets: 306 10*3/uL (ref 150–400)
RBC: 3.97 MIL/uL (ref 3.87–5.11)
RDW: 12.4 % (ref 11.5–15.5)
WBC: 5.6 10*3/uL (ref 4.0–10.5)
nRBC: 0 % (ref 0.0–0.2)

## 2020-03-19 LAB — URINE CULTURE

## 2020-03-19 LAB — COMPREHENSIVE METABOLIC PANEL
ALT: 13 U/L (ref 0–44)
AST: 16 U/L (ref 15–41)
Albumin: 3.7 g/dL (ref 3.5–5.0)
Alkaline Phosphatase: 57 U/L (ref 38–126)
Anion gap: 8 (ref 5–15)
BUN: 5 mg/dL — ABNORMAL LOW (ref 6–20)
CO2: 24 mmol/L (ref 22–32)
Calcium: 8.5 mg/dL — ABNORMAL LOW (ref 8.9–10.3)
Chloride: 107 mmol/L (ref 98–111)
Creatinine, Ser: 0.56 mg/dL (ref 0.44–1.00)
GFR calc Af Amer: >60 mL/min (ref >60)
GFR calc non Af Amer: >60 mL/min (ref >60)
Glucose, Bld: 101 mg/dL — ABNORMAL HIGH (ref 70–99)
Potassium: 3.1 mmol/L — ABNORMAL LOW (ref 3.5–5.1)
Sodium: 139 mmol/L (ref 135–145)
Total Bilirubin: 0.7 mg/dL (ref 0.3–1.2)
Total Protein: 6.8 g/dL (ref 6.5–8.1)

## 2020-03-19 LAB — I-STAT BETA HCG BLOOD, ED (MC, WL, AP ONLY): I-stat hCG, quantitative: <5 m[IU]/mL (ref <5)

## 2020-03-19 LAB — LIPASE, BLOOD: Lipase: 20 U/L (ref 11–51)

## 2020-03-19 MED ORDER — LOPERAMIDE HCL 2 MG PO CAPS
2.0000 mg | ORAL_CAPSULE | Freq: Four times a day (QID) | ORAL | 0 refills | Status: DC | PRN
Start: 2020-03-19 — End: 2020-06-11

## 2020-03-19 MED ORDER — DICYCLOMINE HCL 20 MG PO TABS
20.0000 mg | ORAL_TABLET | Freq: Two times a day (BID) | ORAL | 0 refills | Status: DC | PRN
Start: 2020-03-19 — End: 2020-06-11

## 2020-03-19 MED ORDER — SODIUM CHLORIDE 0.9% FLUSH: 3.0000 mL | Freq: Once | INTRAVENOUS | Status: DC

## 2020-03-19 MED ORDER — LOPERAMIDE HCL 2 MG PO CAPS
2.0000 mg | ORAL_CAPSULE | Freq: Once | ORAL | Status: AC
  Administered 2020-03-19: 2 mg via ORAL
  Filled 2020-03-19: qty 1

## 2020-03-19 MED ORDER — POTASSIUM CHLORIDE CRYS ER 20 MEQ PO TBCR
40.0000 meq | EXTENDED_RELEASE_TABLET | Freq: Once | ORAL | Status: AC
  Administered 2020-03-19: 40 meq via ORAL
  Filled 2020-03-19: qty 2

## 2020-03-19 MED ORDER — ONDANSETRON 4 MG PO TBDP
4.0000 mg | ORAL_TABLET | Freq: Once | ORAL | Status: AC
  Administered 2020-03-19: 4 mg via ORAL
  Filled 2020-03-19: qty 1

## 2020-03-19 MED ORDER — FLORANEX PO PACK
1.0000 g | PACK | Freq: Three times a day (TID) | ORAL | 1 refills | Status: DC
Start: 2020-03-19 — End: 2020-06-11

## 2020-03-19 MED ORDER — LOPERAMIDE HCL 2 MG PO CAPS: 4.0000 mg | ORAL_CAPSULE | Freq: Once | ORAL | Status: DC

## 2020-03-19 NOTE — Discharge Instructions (Signed)
Please read and follow all provided instructions.  Your diagnoses today include:  1. Abdominal cramps   2. Diarrhea, unspecified type     Tests performed today include:  Blood counts and electrolytes - low potassium and normal infection fighting cells  Blood tests to check liver and kidney function  Blood tests to check pancreas function  Urine test to look for infection - no infection in urine, culture sent to confirm  Vital signs. See below for your results today.   Medications prescribed:   Bentyl - medication for intestinal cramps and spasms Do not take this medication if you are breast-feeding   Imodium - medication for diarrhea Do not take this medication if you are breast-feeding  Take any prescribed medications only as directed.  Home care instructions:   Follow any educational materials contained in this packet.  Follow-up instructions: Please follow-up with your primary care provider in the next 3 days for further evaluation of your symptoms.    Return instructions:  SEEK IMMEDIATE MEDICAL ATTENTION IF:  The pain does not go away or becomes severe   A temperature above 101F develops   Repeated vomiting occurs (multiple episodes)   The pain becomes localized to portions of the abdomen. The right side could possibly be appendicitis. In an adult, the left lower portion of the abdomen could be colitis or diverticulitis.   Blood is being passed in stools or vomit (bright red or black tarry stools)   You develop chest pain, difficulty breathing, dizziness or fainting, or become confused, poorly responsive, or inconsolable (young children)  If you have any other emergent concerns regarding your health  Additional Information: Abdominal (belly) pain can be caused by many things. Your caregiver performed an examination and possibly ordered blood/urine tests and imaging (CT scan, x-rays, ultrasound). Many cases can be observed and treated at home after initial  evaluation in the emergency department. Even though you are being discharged home, abdominal pain can be unpredictable. Therefore, you need a repeated exam if your pain does not resolve, returns, or worsens. Most patients with abdominal pain don't have to be admitted to the hospital or have surgery, but serious problems like appendicitis and gallbladder attacks can start out as nonspecific pain. Many abdominal conditions cannot be diagnosed in one visit, so follow-up evaluations are very important.  Your vital signs today were: BP 121/73 (BP Location: Right Arm)   Pulse 81   Temp 98.5 F (36.9 C) (Oral)   Resp 18   LMP  (LMP Unknown)   SpO2 99%  If your blood pressure (bp) was elevated above 135/85 this visit, please have this repeated by your doctor within one month. --------------

## 2020-03-19 NOTE — ED Triage Notes (Signed)
Pt arrives to ED w/ c/o abdominal pain 6/10 pain and diarrhea since Friday. Pt also reports breast pain and was seen here recently for that. Pt denies n/v.

## 2020-03-19 NOTE — ED Provider Notes (Signed)
[facility] EMERGENCY DEPARTMENT Provider Note   CSN: [id number] Arrival date & time: 03/19/20  1210     History Chief Complaint  Patient presents with  . Abdominal Pain    Allison Ferrell is a 19 y.o. female.  Patient with no past surgical history presents to the emergency department for ongoing abdominal pain and diarrhea.  Patient also has had pain into her left breast.  She has a child at home who is 58 months old and continues to breast-feed.  Patient was seen at [facility] ED on 5/12 for breast pain and suprapubic pain.  She had reassuring urine, culture which grew out multiple species, normal breast exam.  No antibiotics were given.  Patient presents due to persistent symptoms including frequent diarrhea.  Stools are watery without blood.  She denies any dysuria, increased frequency, urgency, hematuria.  Pain is migratory, currently mild in the umbilical area.  No chest pain or shortness of breath.  Patient denies any fevers.  No known suspicious food or water exposures.  Denies alcohol or heavy NSAID use.        Past Medical History:  Diagnosis Date  . Anemia   . Chlamydia     Patient Active Problem List   Diagnosis Date Noted  . Preeclampsia, third trimester 10/21/2018    History reviewed. No pertinent surgical history.   OB History    Gravida  1   Para  1   Term  1   Preterm      AB      Living  1     SAB      TAB      Ectopic      Multiple  0   Live Births  1           History reviewed. No pertinent family history.  Social History   Tobacco Use  . Smoking status: Never Smoker  . Smokeless tobacco: Never Used  Substance Use Topics  . Alcohol use: Never  . Drug use: Never    Home Medications Prior to Admission medications   Medication Sig Start Date End Date Taking? Authorizing Provider  ibuprofen (ADVIL) 600 MG tablet Take 1 tablet (600 mg total) by mouth every 6 (six) hours as needed (for pain).  03/18/20   Molpus, Jonny Ruiz, MD    Allergies    Patient has no known allergies.  Review of Systems   Review of Systems  Constitutional: Negative for fever.  HENT: Negative for rhinorrhea and sore throat.   Eyes: Negative for redness.  Respiratory: Negative for cough.   Cardiovascular: Positive for chest pain (breast pain).  Gastrointestinal: Positive for abdominal pain, diarrhea and nausea. Negative for blood in stool, constipation and vomiting.  Genitourinary: Negative for dysuria.  Musculoskeletal: Negative for myalgias.  Skin: Negative for rash.  Neurological: Negative for headaches.    Physical Exam Updated Vital Signs BP 123/67 (BP Location: Right Arm)   Pulse 84   Temp 98.5 F (36.9 C) (Oral)   Resp 16   LMP  (LMP Unknown)   SpO2 100%   Physical Exam Vitals and nursing note reviewed.  Constitutional:      Appearance: She is well-developed.  HENT:     Head: Normocephalic and atraumatic.  Eyes:     General:        Right eye: No discharge.        Left eye: No discharge.     Conjunctiva/sclera: Conjunctivae normal.  Cardiovascular:  Rate and Rhythm: Normal rate and regular rhythm.     Heart sounds: Normal heart sounds.  Pulmonary:     Effort: Pulmonary effort is normal.     Breath sounds: Normal breath sounds.  Abdominal:     Palpations: Abdomen is soft.     Tenderness: There is abdominal tenderness (minimal) in the periumbilical area. There is no guarding or rebound. Negative signs include Murphy's sign and McBurney's sign.  Musculoskeletal:     Cervical back: Normal range of motion and neck supple.  Skin:    General: Skin is warm and dry.  Neurological:     Mental Status: She is alert.     ED Results / Procedures / Treatments   Labs (all labs ordered are listed, but only abnormal results are displayed) Labs Reviewed  COMPREHENSIVE METABOLIC PANEL - Abnormal; Notable for the following components:      Result Value   Potassium 3.1 (*)    Glucose, Bld  101 (*)    BUN 5 (*)    Calcium 8.5 (*)    All other components within normal limits  CBC - Abnormal; Notable for the following components:   Hemoglobin 11.7 (*)    All other components within normal limits  URINALYSIS, ROUTINE W REFLEX MICROSCOPIC - Abnormal; Notable for the following components:   APPearance HAZY (*)    All other components within normal limits  URINE CULTURE  LIPASE, BLOOD  I-STAT BETA HCG BLOOD, ED ([facility], [facility], [facility] ONLY)    EKG None  Radiology No results found.  Procedures Procedures (including critical care time)  Medications Ordered in ED Medications  sodium chloride flush (NS) 0.9 % injection 3 mL (3 mLs Intravenous Not Given 03/19/20 1511)  ondansetron (ZOFRAN-ODT) disintegrating tablet 4 mg (4 mg Oral Given 03/19/20 1510)  potassium chloride SA (KLOR-CON) CR tablet 40 mEq (40 mEq Oral Given 03/19/20 1510)  loperamide (IMODIUM) capsule 2 mg (2 mg Oral Given 03/19/20 1510)    ED Course  I have reviewed the triage vital signs and the nursing notes.  Pertinent labs & imaging results that were available during my care of the patient were reviewed by me and considered in my medical decision making (see chart for details).  Patient seen and examined.  Reviewed lab work-up to this point.  Patient with mild hypokalemia which could be related to diarrhea.  Otherwise normal white blood cell count, liver function test, lipase.  Will resend urine, specimen at bedside.  Culture reordered as well.  Patient will likely need symptom control based on whether or not she will continue breast-feeding.  She looks well, nontoxic.  No indications for advanced imaging at this point.  Abdomen is soft and minimally tender in the periumbilical area.  Vital signs reviewed and are as follows: BP 123/67 (BP Location: Right Arm)   Pulse 84   Temp 98.5 F (36.9 C) (Oral)   Resp 16   LMP  (LMP Unknown)   SpO2 100%   4:05 PM urine test without signs of infection.  Patient tolerated  fluids well.  We discussed treatment of diarrhea in setting of breast-feeding.  Patient states that she planned to discontinue breast-feeding in about a month and at this point it is only very occasionally.  We discussed that if she elects to take the Imodium or the Bentyl, that she should not breast-feed while taking this medication.  Otherwise she may use the prescribed probiotics.  The patient was urged to return to the Emergency Department  immediately with worsening of current symptoms, worsening abdominal pain, persistent vomiting, blood noted in stools, fever, or any other concerns. The patient verbalized understanding.      MDM Rules/Calculators/A&P                      Patient with ongoing, migrating abdominal cramping with reported diarrhea that is nonbloody.  Patient has not had a fever.  Her abdomen exam is reassuring with only minimal tenderness and no rebound or guarding.  White blood cell count is normal.  Normal liver, pancreas, kidney function.  Mild hypokalemia noted today which was repleted.  Likely related to diarrhea.  No signs of dehydration, patient is tolerating PO's. Lungs are clear and no signs suggestive of PNA. Low concern for appendicitis, cholecystitis, pancreatitis, ruptured viscus, UTI, kidney stone, aortic dissection, aortic aneurysm or other emergent abdominal etiology. Supportive therapy indicated with return if symptoms worsen.     Final Clinical Impression(s) / ED Diagnoses Final diagnoses:  Abdominal cramps  Diarrhea, unspecified type    Rx / DC Orders ED Discharge Orders         Ordered    loperamide (IMODIUM) 2 MG capsule  4 times daily PRN     03/19/20 1603    dicyclomine (BENTYL) 20 MG tablet  2 times daily PRN     03/19/20 1603    lactobacillus (FLORANEX/LACTINEX) PACK  3 times daily with meals     03/19/20 1603           Renne Crigler, PA-C 03/19/20 1608    Tilden Fossa, MD 03/19/20 2115

## 2020-03-21 LAB — URINE CULTURE: Culture: <10000 — AB

## 2020-06-11 ENCOUNTER — Inpatient Hospital Stay (HOSPITAL_COMMUNITY): Payer: Self-pay

## 2020-06-11 ENCOUNTER — Other Ambulatory Visit: Payer: Self-pay

## 2020-06-11 ENCOUNTER — Ambulatory Visit: Payer: Self-pay | Admitting: *Deleted

## 2020-06-11 ENCOUNTER — Encounter (HOSPITAL_COMMUNITY): Payer: Self-pay | Admitting: Family Medicine

## 2020-06-11 ENCOUNTER — Inpatient Hospital Stay (HOSPITAL_COMMUNITY)
Admission: AD | Admit: 2020-06-11 | Discharge: 2020-06-11 | Disposition: A | Discharge status: Home / Self Care | Payer: Self-pay | Attending: Family Medicine | Admitting: Family Medicine

## 2020-06-11 DIAGNOSIS — Z3491 Encounter for supervision of normal pregnancy, unspecified, first trimester: Secondary | ICD-10-CM

## 2020-06-11 DIAGNOSIS — Z79899 Other long term (current) drug therapy: Secondary | ICD-10-CM | POA: Insufficient documentation

## 2020-06-11 DIAGNOSIS — O209 Hemorrhage in early pregnancy, unspecified: Secondary | ICD-10-CM

## 2020-06-11 DIAGNOSIS — O200 Threatened abortion: Secondary | ICD-10-CM | POA: Insufficient documentation

## 2020-06-11 DIAGNOSIS — Z3A01 Less than 8 weeks gestation of pregnancy: Secondary | ICD-10-CM | POA: Insufficient documentation

## 2020-06-11 DIAGNOSIS — Z791 Long term (current) use of non-steroidal anti-inflammatories (NSAID): Secondary | ICD-10-CM | POA: Insufficient documentation

## 2020-06-11 DIAGNOSIS — Z8759 Personal history of other complications of pregnancy, childbirth and the puerperium: Secondary | ICD-10-CM | POA: Insufficient documentation

## 2020-06-11 DIAGNOSIS — Z87440 Personal history of urinary (tract) infections: Secondary | ICD-10-CM | POA: Insufficient documentation

## 2020-06-11 HISTORY — DX: Unspecified infectious disease: B99.9

## 2020-06-11 LAB — URINALYSIS, ROUTINE W REFLEX MICROSCOPIC
Bilirubin Urine: NEGATIVE
Glucose, UA: NEGATIVE mg/dL
Ketones, ur: NEGATIVE mg/dL
Nitrite: NEGATIVE
Protein, ur: NEGATIVE mg/dL
Specific Gravity, Urine: 1.023 (ref 1.005–1.030)
pH: 5 (ref 5.0–8.0)

## 2020-06-11 LAB — CBC
HCT: 33.7 % — ABNORMAL LOW (ref 36.0–46.0)
Hemoglobin: 11 g/dL — ABNORMAL LOW (ref 12.0–15.0)
MCH: 28.9 pg (ref 26.0–34.0)
MCHC: 32.6 g/dL (ref 30.0–36.0)
MCV: 88.7 fL (ref 80.0–100.0)
Platelets: 366 10*3/uL (ref 150–400)
RBC: 3.8 MIL/uL — ABNORMAL LOW (ref 3.87–5.11)
RDW: 13.6 % (ref 11.5–15.5)
WBC: 11.3 10*3/uL — ABNORMAL HIGH (ref 4.0–10.5)
nRBC: 0 % (ref 0.0–0.2)

## 2020-06-11 LAB — HCG, QUANTITATIVE, PREGNANCY: hCG, Beta Chain, Quant, S: 161025 m[IU]/mL — ABNORMAL HIGH (ref <5)

## 2020-06-11 LAB — WET PREP, GENITAL
Trich, Wet Prep: NONE SEEN
Yeast Wet Prep HPF POC: NONE SEEN

## 2020-06-11 NOTE — Telephone Encounter (Signed)
Pt called in on the community line.   She does not have an Ob-gyn doctor.   She is 7 weeks and 1 day pregnant and is experiencing vaginal bleeding and cramping that started yesterday.   No cramping this morning but having darker pink vaginal discharge/bleeding.  Since she does not have an Ob-gyn I have referred her to the ED at [facility] where the [facility] and [facility] is located.   She was agreeable to going and "I'm going now".      Reason for Disposition . [1] Intermittent lower abdominal pain (e.g., cramping) AND [2] present > 24 hours  Answer Assessment - Initial Assessment Questions 1. ONSET: "When did this bleeding start?"       Yesterday I had pink bleeding and this morning it's darker.  I'm pregnant at [redacted] weeks now.  No cramping yesterday but not now.   I felt my abd is tight and hurting.   2. DESCRIPTION: "Describe the bleeding that you are having." "How much bleeding is there?"    - SPOTTING: spotting, or pinkish / brownish mucous discharge; does not fill panti-liner or pad    - MILD:  less than 1 pad / hour; less than patient's usual menstrual bleeding   - MODERATE: 1-2 pads / hour; 1 menstrual cup every 6 hours; small-medium blood clots (e.g., pea, grape, small coin)   - SEVERE: soaking 2 or more pads/hour for 2 or more hours; 1 menstrual cup every 2 hours; bleeding not contained by pads or continuous red blood from vagina; large blood clots (e.g., golf ball, large coin)      Mild 3. ABDOMINAL PAIN SEVERITY: If present, ask: "How bad is it?"  (e.g., Scale 1-10; mild, moderate, or severe)   - MILD (1-3): doesn't interfere with normal activities, abdomen soft and not tender to touch    - MODERATE (4-7): interferes with normal activities or awakens from sleep, tender to touch    - SEVERE (8-10): excruciating pain, doubled over, unable to do any normal activities     No cramping today.  I did yesterday. 4. PREGNANCY: "Do you know how many weeks or months pregnant you  are?" "When was the first day of your last normal menstrual period?"     7 weeks   This is my 2nd child. 5. HEMODYNAMIC STATUS: "Are you weak or feeling lightheaded?" If Yes, ask: "Can you stand and walk normally?"      No 6. OTHER SYMPTOMS: "What other symptoms are you having with the bleeding?" (e.g., passed tissue, vaginal discharge, fever, menstrual-type cramps)     Cramping yesterday  Protocols used: PREGNANCY - VAGINAL BLEEDING LESS THAN [redacted] WEEKS EGA-A-AH

## 2020-06-11 NOTE — Discharge Instructions (Signed)
[facility] [employer] for [employer] at [facility]  [address] [place], [place] [address]  [phone]  Center for [facility]  [address], [place], [place] [address]  [phone]  Center for [facility] at [facility] [address] [place], [place] [address]  [phone]  Center for [facility] at [facility]  [address] [place], [place] [address]  [phone]  Center for [facility] at [facility]  [address]First floor), [place], [place] [address]  [phone]  Center for [facility] [address], [place], [place] [address] [phone]  Center for [facility] at [facility]  [address] [place], [place], [place] [address]  [phone]  [facility]  [address], [place], [place] [address]  [phone]  [facility]  [address] [place], [place], [place] [address]  [phone]  [facility]  [address] [place], [place] [address]  [phone]  [facility]  [address] [place], [place] [address]  [phone]  [facility]  [address], [place], [place] [address]  [phone]  [facility]   [address] [place], [place] [address]  [phone]  [facility]  [address], [place], [place] [address]   [phone]  [facility]  [address], [place], [place] [address]  [phone]           Threatened Miscarriage  A threatened miscarriage occurs when a woman has vaginal bleeding during the first 20 weeks of pregnancy but the pregnancy has not ended. If you have vaginal bleeding during this time, your health care provider will do tests to make sure you are still pregnant. If the tests show that you are still pregnant and that the developing baby (fetus) inside your uterus is still growing, your  condition is considered a threatened miscarriage. A threatened miscarriage does not mean your pregnancy will end, but it does increase the risk of losing your pregnancy (complete miscarriage). What are the causes? The cause of this condition is usually not known. For women who go on to have a complete miscarriage, the most common cause is an abnormal number of chromosomes in the developing baby. Chromosomes are the structures inside cells that hold all of a person's genetic material. What increases the risk? The following lifestyle factors may increase your risk of a miscarriage in early pregnancy:  Smoking.  Drinking excessive amounts of alcohol or caffeine.  Recreational drug use. The following preexisting health conditions may increase your risk of a miscarriage in early pregnancy:  Polycystic ovary syndrome.  Uterine fibroids.  Infections.  Diabetes mellitus. What are the signs or symptoms? Symptoms of this condition include:  Vaginal bleeding.  Mild abdominal pain or cramps. How is this diagnosed? If you have bleeding with or without abdominal pain before 20 weeks of pregnancy, your health care provider will do tests to check whether you are still pregnant. These will include:  Ultrasound. This test uses sound waves to create images of the inside of your uterus. This allows your health care provider to look at your developing baby and other structures, such as your placenta.  Pelvic exam. This is an internal exam of your vagina and cervix.  Measurement of your baby's heart rate.  Laboratory tests such as blood tests, urine tests, or  swabs for infection You may be diagnosed with a threatened miscarriage if:  Ultrasound testing shows that you are still pregnant.  Your baby's heart rate is strong.  A pelvic exam shows that the opening between your uterus and your vagina (cervix) is closed.  Blood tests confirm that you are still pregnant. How is this treated? No  treatments have been shown to prevent a threatened miscarriage from going on to a complete miscarriage. However, the right home care is important. Follow these instructions at home:  Get plenty of rest.  Do not have sex or use tampons if you have vaginal bleeding.  Do not douche.  Do not smoke or use recreational drugs.  Do not drink alcohol.  Avoid caffeine.  Keep all follow-up prenatal visits as told by your health care provider. This is important. Contact a health care provider if:  You have light vaginal bleeding or spotting while pregnant.  You have abdominal pain or cramping.  You have a fever. Get help right away if:  You have heavy vaginal bleeding.  You have blood clots coming from your vagina.  You pass tissue from your vagina.  You leak fluid, or you have a gush of fluid from your vagina.  You have severe low back pain or abdominal cramps.  You have fever, chills, and severe abdominal pain. Summary  A threatened miscarriage occurs when a woman has vaginal bleeding during the first 20 weeks of pregnancy but the pregnancy has not ended.  The cause of a threatened miscarriage is usually not known.  Symptoms of this condition may include vaginal bleeding and mild abdominal pain or cramps.  No treatments have been shown to prevent a threatened miscarriage from going on to a complete miscarriage.  Keep all follow-up prenatal visits as told by your health care provider. This is important. This information is not intended to replace advice given to you by your health care provider. Make sure you discuss any questions you have with your health care provider. Document Revised: 11/29/2017 Document Reviewed: 01/19/2017 Elsevier Patient Education  2020 [employer].

## 2020-06-11 NOTE — MAU Provider Note (Signed)
Chief Complaint  Patient presents with  . Vaginal Bleeding   History of Present Illness:  Allison Ferrell is a 19 y.o. G74P1001 female who presents at [redacted]w[redacted]d EGA (based on LMP) with vaginal bleeding. Began experiencing a sharp mid-line lower abdominal pain last night. Rates the pain as a 6/10 that does not radiate. Pain waxes and wanes, patient not experiencing pain at the moment. This morning patient noticed a light-pink color in the toilet after voiding. Patient placed on pad and came to MAU. Reports that she noticed the pad was brown in coloration. Denies fever, chills, back pain, light-headedness, cramping, recent intercourse, vaginal discharge, dysuria, or changes in urinary frequency. Patient does not smoke. Currently taking Keflex for UTI on 06/02/2020, but taking no other medications. No history of ectopic pregnancies or early pregnancy loss.    Pertinent Gynecological History: Menses: none 2/2 pregnancy. Bleeding: light-vaginal bleeding this morning. Contraception: none DES exposure: unknown Blood transfusions: none Sexually transmitted diseases: past history: chlamydia history section of chart, do not see any notes or encounters in chart for with Dx of chlamydia. Previous GYN Procedures: none  Last mammogram: unknown Last pap: unknown   Past Medical History:  Diagnosis Date  . Anemia   . Chlamydia   . Infection    UTI    Past Surgical History:  Procedure Laterality Date  . NO PAST SURGERIES      Family History  Problem Relation Age of Onset  . Healthy Mother   . Healthy Father     Social History   Tobacco Use  . Smoking status: Never Smoker  . Smokeless tobacco: Never Used  Vaping Use  . Vaping Use: Never used  Substance Use Topics  . Alcohol use: Never  . Drug use: Never    Allergies: No Known Allergies  Medications Prior to Admission  Medication Sig Dispense Refill Last Dose  . cephALEXin (KEFLEX) 500 MG capsule Take 500 mg by mouth 2 (two)  times daily.   06/10/2020 at Unknown time  . ibuprofen (ADVIL) 600 MG tablet Take 1 tablet (600 mg total) by mouth every 6 (six) hours as needed (for pain). 30 tablet 0 Past Month at Unknown time  . Prenatal Vit-Fe Fumarate-FA (PRENATAL MULTIVITAMIN) TABS tablet Take 1 tablet by mouth daily at 12 noon.   06/10/2020 at Unknown time  . dicyclomine (BENTYL) 20 MG tablet Take 1 tablet (20 mg total) by mouth 2 (two) times daily as needed (for stomach cramps and spasms). Do not breastfeed while taking this medication. 20 tablet 0 More than a month at Unknown time  . lactobacillus (FLORANEX/LACTINEX) PACK Take 1 packet (1 g total) by mouth 3 (three) times daily with meals. 12 packet 1 More than a month at Unknown time  . loperamide (IMODIUM) 2 MG capsule Take 1 capsule (2 mg total) by mouth 4 (four) times daily as needed for diarrhea or loose stools. Do not breastfeed while taking this medication. 12 capsule 0 More than a month at Unknown time    Review of Systems  Constitutional: Negative for appetite change, chills, diaphoresis, fatigue and fever.  HENT: Negative.   Eyes: Negative for visual disturbance.  Respiratory: Negative for chest tightness and shortness of breath.   Cardiovascular: Negative for chest pain and leg swelling.  Gastrointestinal: Negative for abdominal pain, anal bleeding, constipation, diarrhea, nausea and vomiting.  Endocrine: Negative for polydipsia and polyuria.  Genitourinary: Positive for vaginal bleeding. Negative for difficulty urinating, dysuria, flank pain, frequency, vaginal discharge and vaginal  pain.  Musculoskeletal: Negative.   Neurological: Negative for dizziness, weakness, light-headedness and headaches.  Psychiatric/Behavioral: Negative.    Physical Exam Blood pressure (!) 119/56, pulse 74, temperature 98.4 F (36.9 C), temperature source Oral, resp. rate 16, height 5\' 4"  (1.626 m), weight 79.2 kg, last menstrual period 04/23/2020, SpO2 100 %, unknown if currently  breastfeeding. Physical Exam Constitutional:      General: She is not in acute distress.    Appearance: Normal appearance. She is not ill-appearing.  HENT:     Head: Normocephalic and atraumatic.     Mouth/Throat:     Mouth: Mucous membranes are moist.  Eyes:     Extraocular Movements: Extraocular movements intact.     Pupils: Pupils are equal, round, and reactive to light.  Cardiovascular:     Rate and Rhythm: Normal rate and regular rhythm.     Heart sounds: No murmur heard.  No friction rub. No gallop.   Pulmonary:     Effort: Pulmonary effort is normal.     Breath sounds: Normal breath sounds.  Abdominal:     General: Bowel sounds are normal.     Palpations: Abdomen is soft.     Tenderness: There is no abdominal tenderness. There is no right CVA tenderness or left CVA tenderness.  Genitourinary:    Vagina: No signs of injury.     Cervix: No friability.     Adnexa: Right adnexa normal and left adnexa normal.     Comments: Dried blood posterior aspect of vaginal wall inferior to cervix. Musculoskeletal:        General: No swelling.  Skin:    General: Skin is warm and dry.     Capillary Refill: Capillary refill takes less than 2 seconds.  Neurological:     Mental Status: She is alert and oriented to person, place, and time.  Psychiatric:        Mood and Affect: Mood and affect normal.        Behavior: Behavior normal.     MAU Course Procedures and Labs:  Results for orders placed or performed during the hospital encounter of 06/11/20 (from the past 24 hour(s))  Urinalysis, Routine w reflex microscopic Urine, Clean Catch     Status: Abnormal   Collection Time: 06/11/20  8:50 AM  Result Value Ref Range   Color, Urine YELLOW YELLOW   APPearance HAZY (A) CLEAR   Specific Gravity, Urine 1.023 1.005 - 1.030   pH 5.0 5.0 - 8.0   Glucose, UA NEGATIVE NEGATIVE mg/dL   Hgb urine dipstick LARGE (A) NEGATIVE   Bilirubin Urine NEGATIVE NEGATIVE   Ketones, ur NEGATIVE  NEGATIVE mg/dL   Protein, ur NEGATIVE NEGATIVE mg/dL   Nitrite NEGATIVE NEGATIVE   Leukocytes,Ua TRACE (A) NEGATIVE   RBC / HPF 0-5 0 - 5 RBC/hpf   WBC, UA 0-5 0 - 5 WBC/hpf   Bacteria, UA RARE (A) NONE SEEN   Squamous Epithelial / LPF 6-10 0 - 5   Mucus PRESENT    Sperm, UA PRESENT   Wet prep, genital     Status: Abnormal   Collection Time: 06/11/20  9:14 AM   Specimen: PATH Cytology Cervicovaginal Ancillary Only  Result Value Ref Range   Yeast Wet Prep HPF POC NONE SEEN NONE SEEN   Trich, Wet Prep NONE SEEN NONE SEEN   Clue Cells Wet Prep HPF POC PRESENT (A) NONE SEEN   WBC, Wet Prep HPF POC MANY (A) NONE SEEN   Sperm PRESENT  CBC     Status: Abnormal   Collection Time: 06/11/20  9:38 AM  Result Value Ref Range   WBC 11.3 (H) 4.0 - 10.5 K/uL   RBC 3.80 (L) 3.87 - 5.11 MIL/uL   Hemoglobin 11.0 (L) 12.0 - 15.0 g/dL   HCT 88.4 (L) 36 - 46 %   MCV 88.7 80.0 - 100.0 fL   MCH 28.9 26.0 - 34.0 pg   MCHC 32.6 30.0 - 36.0 g/dL   RDW 16.6 06.3 - 01.6 %   Platelets 366 150 - 400 K/uL   nRBC 0.0 0.0 - 0.2 %     MDM Jovonna Lucrezia Starch is a 19 y.o. G2P1001 female at [redacted]w[redacted]d who presented with vaginal bleeding. Reports some sharp, non-radiating, waxing and waning pain yesterday evening (8/5). This morning noticed light-pink coloration in toilet after voiding. Placed pad prior to coming to MAU, states was brown in coloration. Not currently experiencing pain. Bleeding in early pregnancy concerning for ectopic pregnancy, pregnancy loss, missed abortion, subchorionic hemorrhage/hematoma, or threatened abortion. Other etiologies considered include physiologic/implantation bleeding, ectropion, trauma, polyps. In the setting of vaginal bleeding in early pregnancy, obtained CBC, UA, B-hCG, wet prep, GC/chlamydia, and transvaginal US. Wet prep positive for clue cells, does not meet Amsel criteria, including no vaginal discharge. UA unremarkable. Ultrasound demonstrated single IUP, negative for  subchorionic hemorrhage or pelvic free fluid. Evidence of "chorionic bump", unsure of etiology, clinically insignificant at this time. B-hCG pending.  Assessment and Plan: Kenyata Napier is a 19 y.o. G53P1001 female s/p at [redacted]w[redacted]d who presented with vaginal bleeding and a ultrasound demonstrating single IUP, without subchorionic hemorrhage.   # Threatened Abortion Presentation of early pregnancy vaginal bleeding with closed cervix and [place] positive for IUP with fetal cardiac activity is diagnostic of early threatened pregnancy loss. - Discussed with patient low-threshold to seek care, including increasing in quantity of pain and vaginal bleeding. - Discussed with patient need to [facility], especially with history of pre-eclampsia. Received care at [facility] last pregnancy, gave patient list of providers.  Sol Blazing, Medical Student 06/11/2020 9:47 AM

## 2020-06-11 NOTE — MAU Provider Note (Signed)
Chief Complaint: Vaginal Bleeding   First Provider Initiated Contact with Patient 06/11/20 [phone]     SUBJECTIVE HPI: Allison Ferrell is a 19 y.o. G2P1001 at [redacted]w[redacted]d who presents to Maternity Admissions reporting vaginal bleeding. Symptoms started this morning with pink blood in toilet. Since then has seen brown spotting. Is not bleeding into a pad or passing blood clots. Has been having some pelvic cramping.  Location: lower abdomen Quality: cramping Severity: currently 0/10 on pain scale Duration: 1 day Timing: intermittent Modifying factors: none Associated signs and symptoms: vaginal bleeding  Past Medical History:  Diagnosis Date   Anemia    Chlamydia    Infection    UTI   Preeclampsia 2019   OB History  Gravida Para Term Preterm AB Living  2 1 1     1   SAB TAB Ectopic Multiple Live Births        0 1    # Outcome Date GA Lbr Len/2nd Weight Sex Delivery Anes PTL Lv  2 Current           1 Term 10/23/18 [redacted]w[redacted]d 04:44 / 00:27 3240 g M Vag-Spont EPI  LIV   Past Surgical History:  Procedure Laterality Date   NO PAST SURGERIES     Social History   Socioeconomic History   Marital status: Single    Spouse name: Not on file   Number of children: Not on file   Years of education: Not on file   Highest education level: Not on file  Occupational History   Not on file  Tobacco Use   Smoking status: Never Smoker   Smokeless tobacco: Never Used  Vaping Use   Vaping Use: Never used  Substance and Sexual Activity   Alcohol use: Never   Drug use: Never   Sexual activity: Yes    Birth control/protection: None  Other Topics Concern   Not on file  Social History Narrative   Not on file   Social Determinants of Health   Financial Resource Strain:    Difficulty of Paying Living Expenses:   Food Insecurity:    Worried About [redacted]w[redacted]d in the Last Year:    Programme researcher, broadcasting/film/video in the Last Year:   Transportation Needs:    Barista (Medical):    Lack of Transportation (Non-Medical):   Physical Activity:    Days of Exercise per Week:    Minutes of Exercise per Session:   Stress:    Feeling of Stress :   Social Connections:    Frequency of Communication with Friends and Family:    Frequency of Social Gatherings with Friends and Family:    Attends Religious Services:    Active Member of Clubs or Organizations:    Attends Automotive engineer:    Marital Status:   Intimate Partner Violence:    Fear of Current or Ex-Partner:    Emotionally Abused:    Physically Abused:    Sexually Abused:    Family History  Problem Relation Age of Onset   Healthy Mother    Healthy Father    No current facility-administered medications on file prior to encounter.   Current Outpatient Medications on File Prior to Encounter  Medication Sig Dispense Refill   cephALEXin (KEFLEX) 500 MG capsule Take 500 mg by mouth 2 (two) times daily.     ibuprofen (ADVIL) 600 MG tablet Take 1 tablet (600 mg total) by mouth every 6 (six) hours as needed (for  pain). 30 tablet 0   Prenatal Vit-Fe Fumarate-FA (PRENATAL MULTIVITAMIN) TABS tablet Take 1 tablet by mouth daily at 12 noon.     dicyclomine (BENTYL) 20 MG tablet Take 1 tablet (20 mg total) by mouth 2 (two) times daily as needed (for stomach cramps and spasms). Do not breastfeed while taking this medication. 20 tablet 0   lactobacillus (FLORANEX/LACTINEX) PACK Take 1 packet (1 g total) by mouth 3 (three) times daily with meals. 12 packet 1   loperamide (IMODIUM) 2 MG capsule Take 1 capsule (2 mg total) by mouth 4 (four) times daily as needed for diarrhea or loose stools. Do not breastfeed while taking this medication. 12 capsule 0   No Known Allergies  I have reviewed patient's Past Medical Hx, Surgical Hx, Family Hx, Social Hx, medications and allergies.   Review of Systems  Constitutional: Negative.   Gastrointestinal: Positive for abdominal  pain. Negative for constipation, diarrhea, nausea and vomiting.  Genitourinary: Positive for dysuria and vaginal bleeding. Negative for vaginal discharge.    OBJECTIVE Patient Vitals for the past 24 hrs:  BP Temp Temp src Pulse Resp SpO2 Height Weight  06/11/20 0831 (!) 119/56 98.4 F (36.9 C) Oral 74 16 100 % 5\' 4"  (1.626 m) 79.2 kg   Constitutional: Well-developed, well-nourished female in no acute distress.  Cardiovascular: normal rate & rhythm, no murmur Respiratory: normal rate and effort. Lung sounds clear throughout GI: Abd soft, non-tender, Pos BS x 4. No guarding or rebound tenderness MS: Extremities nontender, no edema, normal ROM Neurologic: Alert and oriented x 4.  GU:     SPECULUM EXAM: NEFG, small amount of brown mucoid blood. Cervix not friable. No abnormal discharge.   BIMANUAL: No CMT. cervix closed; uterus normal size, no adnexal tenderness or masses.    LAB RESULTS Results for orders placed or performed during the hospital encounter of 06/11/20 (from the past 24 hour(s))  Urinalysis, Routine w reflex microscopic Urine, Clean Catch     Status: Abnormal   Collection Time: 06/11/20  8:50 AM  Result Value Ref Range   Color, Urine YELLOW YELLOW   APPearance HAZY (A) CLEAR   Specific Gravity, Urine 1.023 1.005 - 1.030   pH 5.0 5.0 - 8.0   Glucose, UA NEGATIVE NEGATIVE mg/dL   Hgb urine dipstick LARGE (A) NEGATIVE   Bilirubin Urine NEGATIVE NEGATIVE   Ketones, ur NEGATIVE NEGATIVE mg/dL   Protein, ur NEGATIVE NEGATIVE mg/dL   Nitrite NEGATIVE NEGATIVE   Leukocytes,Ua TRACE (A) NEGATIVE   RBC / HPF 0-5 0 - 5 RBC/hpf   WBC, UA 0-5 0 - 5 WBC/hpf   Bacteria, UA RARE (A) NONE SEEN   Squamous Epithelial / LPF 6-10 0 - 5   Mucus PRESENT    Sperm, UA PRESENT   Wet prep, genital     Status: Abnormal   Collection Time: 06/11/20  9:14 AM   Specimen: PATH Cytology Cervicovaginal Ancillary Only  Result Value Ref Range   Yeast Wet Prep HPF POC NONE SEEN NONE SEEN   Trich,  Wet Prep NONE SEEN NONE SEEN   Clue Cells Wet Prep HPF POC PRESENT (A) NONE SEEN   WBC, Wet Prep HPF POC MANY (A) NONE SEEN   Sperm PRESENT   CBC     Status: Abnormal   Collection Time: 06/11/20  9:38 AM  Result Value Ref Range   WBC 11.3 (H) 4.0 - 10.5 K/uL   RBC 3.80 (L) 3.87 - 5.11 MIL/uL   Hemoglobin 11.0 (  L) 12.0 - 15.0 g/dL   HCT 24.5 (L) 36 - 46 %   MCV 88.7 80.0 - 100.0 fL   MCH 28.9 26.0 - 34.0 pg   MCHC 32.6 30.0 - 36.0 g/dL   RDW 80.9 98.3 - 38.2 %   Platelets 366 150 - 400 K/uL   nRBC 0.0 0.0 - 0.2 %    IMAGING US OB Comp Less 14 Wks  Result Date: 06/11/2020 CLINICAL DATA:  19 year old female with some pain and vaginal bleeding. Estimated gestational age by LMP 7 weeks 0 days. EXAM: OBSTETRIC <14 WK ULTRASOUND TECHNIQUE: Transabdominal ultrasound was performed for evaluation of the gestation as well as the maternal uterus and adnexal regions. COMPARISON:  None. FINDINGS: Intrauterine gestational sac: Single Yolk sac:  Visible Embryo:  Visible Cardiac Activity: Detected Heart Rate: 155 bpm CRL:   12.3 mm   7 w 3 d                  [place] EDC: 01/25/2021 Subchorionic hemorrhage: None visualized. However, there is evidence of a "chorionic bump" (image 15). Maternal uterus/adnexae: No pelvic free fluid. Both ovaries appear normal. The right ovary measures 2.9 x 1.2 x 1.4 cm. The left ovary measures 3.1 x 2.3 x 2.6 cm and includes a small anechoic cyst (image 21). IMPRESSION: 1. Single living IUP demonstrated, with estimated gestational age of [redacted] weeks and 3 days by crown-rump length. 2. Negative for subchorionic hemorrhage or pelvic free fluid, however, there is evidence of a "chorionic bump" (image 15). Electronically Signed   By: Odessa Fleming M.D.   On: 06/11/2020 10:01    MAU COURSE Orders Placed This Encounter  Procedures   Wet prep, genital   US OB Comp Less 14 Wks   Urinalysis, Routine w reflex microscopic   CBC   hCG, quantitative, pregnancy   Diet NPO time specified    Discharge patient   No orders of the defined types were placed in this encounter.   MDM +UPT UA, wet prep, GC/chlamydia, CBC, ABO/Rh, quant hCG, and [place] today to rule out ectopic pregnancy which can be life threatening.   RH positive  Minimal blood on exam & cervix closed. Ultrasound shows live IUP. ?chorionic bump Discussed threatened miscarriage with patient & reviewed bleeding precautions. Encouraged pelvic rest. Patient unsure of where she will get prenatal care - given list of providers.   ASSESSMENT 1. Threatened miscarriage in early pregnancy   2. Vaginal bleeding in pregnancy, first trimester   3. Normal IUP (intrauterine pregnancy) on prenatal ultrasound, first trimester   4. [redacted] weeks gestation of pregnancy     PLAN Discharge home in stable condition. Bleeding/miscarriage precautions Pelvic rest GC/CT pending Start prenatal care   Allergies as of 06/11/2020   No Known Allergies     Medication List    STOP taking these medications   dicyclomine 20 MG tablet Commonly known as: BENTYL   ibuprofen 600 MG tablet Commonly known as: ADVIL   lactobacillus Pack   loperamide 2 MG capsule Commonly known as: IMODIUM     TAKE these medications   cephALEXin 500 MG capsule Commonly known as: KEFLEX Take 500 mg by mouth 2 (two) times daily.   prenatal multivitamin Tabs tablet Take 1 tablet by mouth daily at 12 noon.        Judeth Horn, NP 06/11/2020  10:53 AM

## 2020-06-11 NOTE — MAU Note (Signed)
Had some light bleeding when woke up this morning.  Called and was told to come in because that is not normal.   No pain today, had some "tightness" yesterday in the lower part.

## 2020-06-13 LAB — GC/CHLAMYDIA PROBE AMP (~~LOC~~) NOT AT ARMC
Chlamydia: NEGATIVE
Comment: NEGATIVE
Comment: NORMAL
Neisseria Gonorrhea: NEGATIVE

## 2020-07-26 ENCOUNTER — Ambulatory Visit (INDEPENDENT_AMBULATORY_CARE_PROVIDER_SITE_OTHER): Payer: Self-pay | Admitting: *Deleted

## 2020-07-26 DIAGNOSIS — Z3480 Encounter for supervision of other normal pregnancy, unspecified trimester: Secondary | ICD-10-CM

## 2020-07-26 MED ORDER — PRENATE PIXIE 10-0.6-0.4-200 MG PO CAPS: 1.0000 | ORAL_CAPSULE | Freq: Every day | ORAL | 11 refills | Status: DC

## 2020-07-26 NOTE — Progress Notes (Signed)
PRENATAL INTAKE SUMMARY  Ms. Allison Ferrell presents today New OB Nurse Interview.  OB History    Gravida  2   Para  1   Term  1   Preterm      AB      Living  1     SAB      TAB      Ectopic      Multiple  0   Live Births  1          I have reviewed the patient's medical, obstetrical, social, and family histories, medications, and available lab results.  SUBJECTIVE She has no unusual complaints  OBJECTIVE Initial Intake Exam (New OB)  GENERAL APPEARANCE: sounds well via televisit   ASSESSMENT Normal pregnancy  PLAN Prenatal care- [facility]

## 2020-08-02 ENCOUNTER — Encounter: Payer: Self-pay | Admitting: Obstetrics

## 2020-08-02 ENCOUNTER — Other Ambulatory Visit: Payer: Self-pay

## 2020-08-11 ENCOUNTER — Encounter: Payer: Self-pay | Admitting: General Practice

## 2020-08-18 ENCOUNTER — Encounter: Payer: Self-pay | Admitting: Certified Nurse Midwife

## 2020-08-18 ENCOUNTER — Encounter: Payer: Self-pay | Admitting: General Practice

## 2020-08-18 ENCOUNTER — Ambulatory Visit (INDEPENDENT_AMBULATORY_CARE_PROVIDER_SITE_OTHER): Payer: Self-pay | Admitting: Certified Nurse Midwife

## 2020-08-18 ENCOUNTER — Other Ambulatory Visit: Payer: Self-pay

## 2020-08-18 VITALS — BP 108/64 | HR 95 | Temp 98.1°F | Wt 174.0 lb

## 2020-08-18 DIAGNOSIS — Z8759 Personal history of other complications of pregnancy, childbirth and the puerperium: Secondary | ICD-10-CM

## 2020-08-18 DIAGNOSIS — Z3A16 16 weeks gestation of pregnancy: Secondary | ICD-10-CM

## 2020-08-18 DIAGNOSIS — Z3480 Encounter for supervision of other normal pregnancy, unspecified trimester: Secondary | ICD-10-CM

## 2020-08-18 DIAGNOSIS — O219 Vomiting of pregnancy, unspecified: Secondary | ICD-10-CM

## 2020-08-18 MED ORDER — ASPIRIN 81 MG PO TBEC: 81.0000 mg | DELAYED_RELEASE_TABLET | Freq: Every day | ORAL | 0 refills | Status: DC

## 2020-08-18 MED ORDER — VITAFOL ULTRA 29-0.6-0.4-200 MG PO CAPS: 1.0000 | ORAL_CAPSULE | Freq: Every day | ORAL | 0 refills | Status: DC

## 2020-08-18 MED ORDER — BONJESTA 20-20 MG PO TBCR: 1.0000 | EXTENDED_RELEASE_TABLET | Freq: Every evening | ORAL | 0 refills | Status: DC | PRN

## 2020-08-18 NOTE — Progress Notes (Signed)
PRENATAL VISIT NOTE  Subjective:  Allison Ferrell is a 19 y.o. G2P1001 at 52w5dbeing seen today for her first prenatal visit for this pregnancy.  She is currently monitored for the following issues for this low-risk pregnancy and has Preeclampsia, third trimester and Supervision of other normal pregnancy, antepartum on their problem list.  Patient reports nausea and round ligament pains.  Contractions: Not present. Vag. Bleeding: None.  Movement: Absent. Denies leaking of fluid.   She is planning to breast and bottle feed. Desires Nexplanon for contraception.   The following portions of the patient's history were reviewed and updated as appropriate: allergies, current medications, past family history, past medical history, past social history, past surgical history and problem list.   Objective:   Vitals:   08/18/20 1459  BP: 108/64  Pulse: 95  Temp: 98.1 F (36.7 C)  Weight: 174 lb (78.9 kg)    Fetal Status: Fetal Heart Rate (bpm): 150   Movement: Absent     General:  Alert, oriented and cooperative. Patient is in no acute distress.  Skin: Skin is warm and dry. No rash noted.   Cardiovascular: Normal heart rate and rhythm noted  Respiratory: Normal respiratory effort, no problems with respiration noted. Clear to auscultation.   Abdomen: Soft, gravid, appropriate for gestational age. Normal bowel sounds. Non-tender. Pain/Pressure: Present     Pelvic: Cervical exam deferred       Normal cervical contour, no lesions, no bleeding following pap, normal discharge  Extremities: Normal range of motion.  Edema: None  Mental Status: Normal mood and affect. Normal behavior. Normal judgment and thought content.   Assessment and Plan:  Pregnancy: G2P1001 at 194w5d. Supervision of other normal pregnancy, antepartum - CBC/D/Plt+RPR+Rh+ABO+Rub Ab... - Culture, OB Urine - Genetic Screening - Hemoglobin A1c - Urine cytology ancillary only(Margaret) - Prenat-Fe  Poly-Methfol-FA-DHA (VITAFOL ULTRA) 29-0.6-0.4-200 MG CAPS; Take 1 tablet by mouth daily.  Dispense: 33 capsule; Refill: 0 (samples given)  2. History of gestational hypertension - Protein / creatinine ratio, urine - Comp Met (CMET) - aspirin (BAYER ASPIRIN EC LOW DOSE) 81 MG EC tablet; Take 1 tablet (81 mg total) by mouth daily. Swallow whole.  Dispense: 30 tablet; Refill: 0 (samples given)  3. Nausea and vomiting of pregnancy, antepartum - Doxylamine-Pyridoxine ER (BONJESTA) 20-20 MG TBCR; Take 1 tablet by mouth at bedtime as needed.  Dispense: 30 tablet; Refill: 0 (samples given) - Pt wearing SeaBand incorrectly, showed her how to wear it and activate the acupressure point for nausea relief  4. 16 weeks of gestation - Discussed the normal visit cadence for prenatal care - Discussed the nature of our practice with multiple providers including residents and students  - Reviewed round ligament massage and stretches to relieve pain/soreness - Gave reassurance that tightness after IC is normal - Pt had small SCDelanot 7-8wks and mild spotting that has resolved, gave reassurance that this is a common occurrence in pregnancy and does not make her high risk - Pt will have anatomy scan at [facility] 3wks  Preterm labor/first trimester warning symptoms and general obstetric precautions including but not limited to vaginal bleeding, contractions, leaking of fluid and fetal movement were reviewed in detail with the patient. Please refer to After Visit Summary for other counseling recommendations.   Return in about 5 weeks (around 09/22/2020) for LOB.  Future Appointments  Date Time Provider DeAltheimer11/17/2021  9:30 AM WaGabriel CarinaCNM [facility] None   JaGaylan Gerold  CNM, MSN, [facility] 08/18/20 4:09 PM

## 2020-08-18 NOTE — Patient Instructions (Addendum)
If you are in need of transportation to get to and from your appointments in our office.  You can reach Transportation Services by calling [phone] Monday - Friday  7am-6pm.  Primer trimestre de embarazo First Trimester of Pregnancy  El primer trimestre de Psychiatrist se extiende desde la semana1 hasta el final de la semana13 (mes1 al mes3). Durante este tiempo, el beb comenzar a desarrollarse dentro suyo. Entre la semana6 y [place], se forman los ojos y Recruitment consultant, y los latidos del corazn pueden escucharse en la ecografa. Al final de las 12semanas, todos los rganos del beb estn formados. El cuidado prenatal es toda la asistencia mdica que usted recibe antes del nacimiento del beb. Asegrese de recibir un buen cuidado prenatal y de seguir todas las indicaciones del mdico. Siga estas indicaciones en su casa: Medicamentos  Tome los medicamentos de venta libre y los recetados solamente como se lo haya indicado el mdico. Algunos medicamentos son seguros para tomar durante el Psychiatrist y otros no lo son.  Tome vitaminas prenatales que contengan por lo menos (?g) de cido flico.  Si tiene problemas para defecar (estreimiento), tome un medicamento que ablanda la materia fecal (laxante), siempre que lo autorice el mdico. Comida y bebida   Ingiera alimentos saludables de [place] regular.  El Firefighter la cantidad de peso que [place].  No coma carne cruda ni quesos sin cocinar.  Si tiene Programme researcher, broadcasting/film/video (nuseas) o vomita (vmitos): ? Ingiera 4 o 5comidas pequeas por Geophysical data processor de 3abundantes. ? Intente comer algunas galletitas saladas. ? Beba lquidos [employer], en lugar de [employer].  Para evitar el estreimiento: ? Consuma alimentos ricos en fibra, como frutas y verduras frescas, cereales integrales y legumbres. ? Beba suficiente lquido para mantener el pis (orina) claro o de color amarillo plido. Actividad  Haga  ejercicios solamente como se lo haya indicado el mdico. Deje de hacer ejercicios si tiene clicos o dolor en la parte baja del vientre (abdomen) o en la cintura.  No haga actividad fsica si el clima est demasiado caluroso o hmedo, o si se encuentra en un lugar muy alto (altitud elevada).  Intente no estar de pie [employer]Ex. Mueva las piernas con frecuencia si debe estar de pie en un lugar durante mucho tiempo.  Evite levantar pesos [employer].  Use zapatos con tacones bajos. Mantenga una buena postura al sentarse y pararse.  Puede tener [employer] [place] [place] [employer], a menos que el mdico le indique lo contrario. Alivio del dolor y del Dentist  Use un sostn que le brinde buen soporte si le duelen las [place].  Dese baos de asiento con agua tibia para Engineer, materials o las molestias causadas por las hemorroides. Use una crema antihemorroidal si el mdico se lo permite.  Descanse con las piernas elevadas si tiene calambres o dolor de cintura.  Si tiene las venas de las piernas hinchadas y abultadas (venas varicosas): ? Use medias elsticas de soporte o medias de compresin como se lo haya indicado el mdico. ? Levante (eleve) los pies durante , 3 o 4veces por Futures trader. ? Limite la sal en sus alimentos. Cuidado prenatal  Programe las visitas prenatales para la semana12 de [place].  Escriba sus preguntas. Llvelas cuando concurra a las visitas prenatales.  Concurra a todas las visitas prenatales como se lo haya indicado el mdico. Esto es importante. Seguridad  Use el cinturn de seguridad en todo momento mientras conduce.  Haga una lista con los  nmeros de telfono en caso de emergencia. Esta lista debe incluir los nmeros de los familiares, los amigos, el hospital y los departamentos de polica y de bomberos. Instrucciones generales  [place] al mdico que la derive a clases prenatales en su localidad. Debe comenzar a tomar las clases antes de Cytogeneticist en el mes6 de  embarazo.  Pida ayuda si necesita asesoramiento o asistencia con la alimentacin. El mdico puede aconsejarla o indicarle dnde recurrir para recibir [place].  No se d baos de inmersin en agua caliente, baos turcos ni saunas.  No se haga duchas vaginales ni use tampones o toallas higinicas perfumadas.  No mantenga las piernas cruzadas durante [place].  Evite las hierbas y el alcohol. Evite los frmacos que el mdico no haya autorizado.  No consuma ningn producto que contenga tabaco, lo que incluye cigarrillos, tabaco de Theatre manager o Administrator, Civil Service. Si necesita ayuda para dejar de fumar, consulte al American Express. Puede recibir asesoramiento u otro tipo de apoyo para dejar de fumar.  Evite el contacto con las bandejas sanitarias de los gatos y la tierra que estos animales usan. Estos elementos contienen grmenes que pueden causar defectos congnitos al beb y la posible prdida del feto (aborto espontneo) o muerte fetal.  Visite al dentista. En su casa, lvese los dientes con un cepillo dental suave. Psese el hilo dental con suavidad. Comunquese con un mdico si:  Tiene mareos.  Tiene clicos leves o siente presin en la parte baja del vientre.  Sufre un dolor persistente en el abdomen.  Sigue teniendo [employer], vomita o la materia fecal es lquida (diarrea).  Nota una secrecin de lquido con olor ftido que proviene de la vagina.  Tiene dolor al hacer pis (orinar).  Tiene el rostro, las [place], las piernas o los tobillos ms hinchados (inflamados). Solicite ayuda de inmediato si:  Tiene fiebre.  Tiene una prdida de lquido por la vagina.  Tiene sangrado o pequeas prdidas vaginales.  Tiene clicos o dolor muy intensos en el vientre.  Sube o baja de peso rpidamente.  Vomita sangre. Esto tiene Art gallery manager similar a la borra del caf.  Est en contacto con personas que tienen rubola, la quinta enfermedad o varicela.  Siente un dolor de cabeza muy  intenso.  Le falta el aire.  Sufre cualquier tipo de traumatismo, por ejemplo, debido a una cada o un accidente automovilstico. Resumen  El primer trimestre de Psychiatrist se extiende desde la semana1 hasta el final de la semana13 (mes1 al mes3).  Para cuidar su salud y la del beb en gestacin, necesitar consumir alimentos saludables, tomar medicamentos solamente si lo autoriza el mdico, y Radio producer actividades que sean seguras para usted y para su beb.  Concurra a todas las visitas de control como se lo haya indicado el mdico. Esto es importante porque el mdico deber asegurar que el beb est saludable y est creciendo bien. Esta informacin no tiene Theme park manager el consejo del mdico. Asegrese de hacerle al mdico cualquier pregunta que tenga. Document Revised: 05/29/2017 Document Reviewed: 05/29/2017 Elsevier Patient Education  2020 [employer].  How to Take Your Blood Pressure You can take your blood pressure at home with a machine. You may need to check your blood pressure at home:  To check if you have high blood pressure (hypertension).  To check your blood pressure over time.  To make sure your blood pressure medicine is working. Supplies needed: You will need a blood pressure machine, or monitor. You can buy one  at a drugstore or online. When choosing one:  Choose one with an arm cuff.  Choose one that wraps around your upper arm. Only one finger should fit between your arm and the cuff.  Do not choose one that measures your blood pressure from your wrist or finger. Your doctor can suggest a monitor. How to prepare Avoid these things for 30 minutes before checking your blood pressure:  Drinking caffeine.  Drinking alcohol.  Eating.  Smoking.  Exercising. Five minutes before checking your blood pressure:  Pee.  Sit in a dining chair. Avoid sitting in a soft couch or armchair.  Be quiet. Do not talk. How to take your blood pressure Follow  the instructions that came with your machine. If you have a digital blood pressure monitor, these may be the instructions: 1. Sit up straight. 2. Place your feet on the floor. Do not cross your ankles or legs. 3. Rest your left arm at the level of your heart. You may rest it on a table, desk, or chair. 4. Pull up your shirt sleeve. 5. Wrap the blood pressure cuff around the upper part of your left arm. The cuff should be 1 inch (2.5 cm) above your elbow. It is best to wrap the cuff around bare skin. 6. Fit the cuff snugly around your arm. You should be able to place only one finger between the cuff and your arm. 7. Put the cord inside the groove of your elbow. 8. Press the power button. 9. Sit quietly while the cuff fills with air and loses air. 10. Write down the numbers on the screen. 11. Wait 2-3 minutes and then repeat steps 1-10. What do the numbers mean? Two numbers make up your blood pressure. The first number is called systolic pressure. The second is called diastolic pressure. An example of a blood pressure reading is "120 over 80" (or 120/80). If you are an adult and do not have a medical condition, use this guide to find out if your blood pressure is normal: Normal  First number: below 120.  Second number: below 80. Elevated  First number: 120-129.  Second number: below 80. Hypertension stage 1  First number: 130-139.  Second number: 80-89. Hypertension stage 2  First number: 140 or above.  Second number: 90 or above. Your blood pressure is above normal even if only the top or bottom number is above normal. Follow these instructions at home:  Check your blood pressure as often as your doctor tells you to.  Take your monitor to your next doctor's appointment. Your doctor will: ? Make sure you are using it correctly. ? Make sure it is working right.  Make sure you understand what your blood pressure numbers should be.  Tell your doctor if your medicines are  causing side effects. Contact a doctor if:  Your blood pressure keeps being high. Get help right away if:  Your first blood pressure number is higher than 180.  Your second blood pressure number is higher than 120. This information is not intended to replace advice given to you by your health care provider. Make sure you discuss any questions you have with your health care provider. Document Revised: 10/05/2017 Document Reviewed: 03/31/2016 Elsevier Patient Education  2020 [employer].   .Ferdinand Cava

## 2020-08-19 ENCOUNTER — Encounter: Payer: Self-pay | Admitting: General Practice

## 2020-08-19 LAB — CBC/D/PLT+RPR+RH+ABO+RUB AB...
Antibody Screen: NEGATIVE
Basophils Absolute: 0 10*3/uL (ref 0.0–0.2)
Basos: 0 %
EOS (ABSOLUTE): 0 10*3/uL (ref 0.0–0.4)
Eos: 0 %
HCV Ab: <0.1 s/co ratio (ref 0.0–0.9)
HIV Screen 4th Generation wRfx: NONREACTIVE
Hematocrit: 32.4 % — ABNORMAL LOW (ref 34.0–46.6)
Hemoglobin: 11.2 g/dL (ref 11.1–15.9)
Hepatitis B Surface Ag: NEGATIVE
Immature Grans (Abs): 0 10*3/uL (ref 0.0–0.1)
Immature Granulocytes: 0 %
Lymphocytes Absolute: 2.1 10*3/uL (ref 0.7–3.1)
Lymphs: 18 %
MCH: 30.4 pg (ref 26.6–33.0)
MCHC: 34.6 g/dL (ref 31.5–35.7)
MCV: 88 fL (ref 79–97)
Monocytes Absolute: 0.8 10*3/uL (ref 0.1–0.9)
Monocytes: 7 %
Neutrophils Absolute: 8.5 10*3/uL — ABNORMAL HIGH (ref 1.4–7.0)
Neutrophils: 75 %
Platelets: 359 10*3/uL (ref 150–450)
RBC: 3.68 x10E6/uL — ABNORMAL LOW (ref 3.77–5.28)
RDW: 13.4 % (ref 11.7–15.4)
RPR Ser Ql: NONREACTIVE
Rh Factor: POSITIVE
Rubella Antibodies, IGG: 1.27 index (ref >0.99)
WBC: 11.5 10*3/uL — ABNORMAL HIGH (ref 3.4–10.8)

## 2020-08-19 LAB — PROTEIN / CREATININE RATIO, URINE
Creatinine, Urine: 54 mg/dL
Protein, Ur: 8.4 mg/dL
Protein/Creat Ratio: 156 mg/g creat (ref 0–200)

## 2020-08-19 LAB — COMPREHENSIVE METABOLIC PANEL
ALT: 8 IU/L (ref 0–32)
AST: 12 IU/L (ref 0–40)
Albumin/Globulin Ratio: 1.4 (ref 1.2–2.2)
Albumin: 3.9 g/dL (ref 3.9–5.0)
Alkaline Phosphatase: 57 IU/L (ref 44–121)
BUN/Creatinine Ratio: 17 (ref 9–23)
BUN: 6 mg/dL (ref 6–20)
Bilirubin Total: 0.5 mg/dL (ref 0.0–1.2)
CO2: 23 mmol/L (ref 20–29)
Calcium: 9.2 mg/dL (ref 8.7–10.2)
Chloride: 102 mmol/L (ref 96–106)
Creatinine, Ser: 0.35 mg/dL — ABNORMAL LOW (ref 0.57–1.00)
GFR calc Af Amer: 180 mL/min/{1.73_m2} (ref >59)
GFR calc non Af Amer: 156 mL/min/{1.73_m2} (ref >59)
Globulin, Total: 2.7 g/dL (ref 1.5–4.5)
Glucose: 99 mg/dL (ref 65–99)
Potassium: 4 mmol/L (ref 3.5–5.2)
Sodium: 137 mmol/L (ref 134–144)
Total Protein: 6.6 g/dL (ref 6.0–8.5)

## 2020-08-19 LAB — HEMOGLOBIN A1C
Est. average glucose Bld gHb Est-mCnc: 111 mg/dL
Hgb A1c MFr Bld: 5.5 % (ref 4.8–5.6)

## 2020-08-19 LAB — HCV INTERPRETATION

## 2020-08-19 NOTE — Addendum Note (Signed)
Addended by: Clovis Pu on: 08/19/2020 10:43 AM   Modules accepted: Orders

## 2020-08-20 LAB — CULTURE, OB URINE

## 2020-08-20 LAB — URINE CULTURE, OB REFLEX

## 2020-08-25 NOTE — Progress Notes (Signed)
Patient ID: Allison Ferrell, female   DOB: 12-26-2000, 19 y.o.   MRN: [id number] Patient was assessed and managed by nursing staff during this encounter. I have reviewed the chart and agree with the documentation and plan. I have also made any necessary editorial changes.   Adam Phenix, MD

## 2020-08-26 ENCOUNTER — Encounter: Payer: Self-pay | Admitting: General Practice

## 2020-08-27 ENCOUNTER — Encounter: Payer: Self-pay | Admitting: General Practice

## 2020-08-31 ENCOUNTER — Telehealth: Payer: Self-pay | Admitting: General Practice

## 2020-08-31 NOTE — Telephone Encounter (Signed)
Patient is aware of [place] with [facility] on 09/06/2020 at 11:00am and verbalized understanding.

## 2020-09-17 ENCOUNTER — Encounter: Payer: Self-pay | Admitting: General Practice

## 2020-09-22 ENCOUNTER — Other Ambulatory Visit: Payer: Self-pay

## 2020-09-22 ENCOUNTER — Ambulatory Visit (INDEPENDENT_AMBULATORY_CARE_PROVIDER_SITE_OTHER): Payer: Self-pay | Admitting: Certified Nurse Midwife

## 2020-09-22 ENCOUNTER — Other Ambulatory Visit (HOSPITAL_COMMUNITY)
Admission: RE | Admit: 2020-09-22 | Discharge: 2020-09-22 | Disposition: A | Discharge status: Home / Self Care | Payer: Self-pay | Source: Ambulatory Visit | Attending: Certified Nurse Midwife | Admitting: Certified Nurse Midwife

## 2020-09-22 VITALS — BP 108/68 | HR 81 | Temp 97.6°F | Wt 181.2 lb

## 2020-09-22 DIAGNOSIS — Z3480 Encounter for supervision of other normal pregnancy, unspecified trimester: Secondary | ICD-10-CM | POA: Insufficient documentation

## 2020-09-22 DIAGNOSIS — Z3A21 21 weeks gestation of pregnancy: Secondary | ICD-10-CM

## 2020-09-22 DIAGNOSIS — Z23 Encounter for immunization: Secondary | ICD-10-CM

## 2020-09-22 MED ORDER — INFLUENZA VAC SPLIT QUAD 0.5 ML IM SUSP
0.5000 mL | Freq: Once | INTRAMUSCULAR | 0 refills | Status: AC
Start: 2020-09-22 — End: 2020-09-22

## 2020-09-22 NOTE — Progress Notes (Signed)
   PRENATAL VISIT NOTE  Subjective:  Allison Ferrell is a 19 y.o. G2P1001 at [redacted]w[redacted]d being seen today for ongoing prenatal care.  She is currently monitored for the following issues for this low-risk pregnancy and has Supervision of other normal pregnancy, antepartum on their problem list.  Patient reports Sciatic pain.  Contractions: Not present. Vag. Bleeding: None.  Movement: Present. Denies leaking of fluid.   The following portions of the patient's history were reviewed and updated as appropriate: allergies, current medications, past family history, past medical history, past social history, past surgical history and problem list.   Objective:   Vitals:   09/22/20 0909  BP: 108/68  Pulse: 81  Temp: 97.6 F (36.4 C)  Weight: 181 lb 3.2 oz (82.2 kg)    Fetal Status: Fetal Heart Rate (bpm): 145 Fundal Height: 21 cm Movement: Present     General:  Alert, oriented and cooperative. Patient is in no acute distress.  Skin: Skin is warm and dry. No rash noted.   Cardiovascular: Normal heart rate noted  Respiratory: Normal respiratory effort, no problems with respiration noted  Abdomen: Soft, gravid, appropriate for gestational age.  Pain/Pressure: Absent     Pelvic: Cervical exam deferred        Extremities: Normal range of motion.  Edema: None  Mental Status: Normal mood and affect. Normal behavior. Normal judgment and thought content.   Assessment and Plan:  Pregnancy: G2P1001 at [redacted]w[redacted]d 1. Supervision of other normal pregnancy, antepartum - Urine cytology ancillary only([place]) - Pt doing well had questions about the Covid vaccine - her family (parents and brother) got Covid last month, wanted reassurance that the vaccine is safe and won't affect her breastmilk. Discussed expectations for a robust immune response and reassurance re passive immunity to the baby. Pt verbalized understanding, plans to get vaccinated.  2. Flu vaccine need - influenza vac split quadrivalent  PF (FLUARIX) 0.5 ML injection; Inject 0.5 mLs into the muscle once for 1 dose. Adopt-A-Mom  Dispense: 0.5 mL; Refill: 0  3. [redacted] weeks gestation of pregnancy - Discussed stretches to ease sciatic pain, encouraged her to try twice daily stretching, if this does not help we can prescribe flexeril at next visit. Pt amenable to plan.  Preterm labor symptoms and general obstetric precautions including but not limited to vaginal bleeding, contractions, leaking of fluid and fetal movement were reviewed in detail with the patient. Please refer to After Visit Summary for other counseling recommendations.   Return in about 4 weeks (around 10/20/2020) for IN-PERSON, LOB.  Future Appointments  Date Time Provider Department Center  10/20/2020  9:30 AM Sharyon Cable, CNM [facility] None    [place], [place]

## 2020-09-23 LAB — URINE CYTOLOGY ANCILLARY ONLY
Chlamydia: NEGATIVE
Comment: NEGATIVE
Comment: NEGATIVE
Comment: NORMAL
Neisseria Gonorrhea: NEGATIVE
Trichomonas: NEGATIVE

## 2020-10-07 ENCOUNTER — Encounter: Payer: Self-pay | Admitting: Obstetrics and Gynecology

## 2020-10-07 ENCOUNTER — Ambulatory Visit (INDEPENDENT_AMBULATORY_CARE_PROVIDER_SITE_OTHER): Payer: Self-pay | Admitting: Obstetrics and Gynecology

## 2020-10-07 ENCOUNTER — Other Ambulatory Visit: Payer: Self-pay

## 2020-10-07 ENCOUNTER — Other Ambulatory Visit (HOSPITAL_COMMUNITY)
Admission: RE | Admit: 2020-10-07 | Discharge: 2020-10-07 | Disposition: A | Discharge status: Home / Self Care | Payer: Self-pay | Source: Ambulatory Visit | Attending: Obstetrics and Gynecology | Admitting: Obstetrics and Gynecology

## 2020-10-07 VITALS — BP 120/67 | HR 94 | Temp 97.9°F | Wt 182.2 lb

## 2020-10-07 DIAGNOSIS — R102 Pelvic and perineal pain: Secondary | ICD-10-CM

## 2020-10-07 DIAGNOSIS — Z3480 Encounter for supervision of other normal pregnancy, unspecified trimester: Secondary | ICD-10-CM

## 2020-10-07 DIAGNOSIS — O26892 Other specified pregnancy related conditions, second trimester: Secondary | ICD-10-CM

## 2020-10-07 DIAGNOSIS — R300 Dysuria: Secondary | ICD-10-CM

## 2020-10-07 DIAGNOSIS — Z3A23 23 weeks gestation of pregnancy: Secondary | ICD-10-CM

## 2020-10-07 DIAGNOSIS — N898 Other specified noninflammatory disorders of vagina: Secondary | ICD-10-CM

## 2020-10-07 DIAGNOSIS — O26899 Other specified pregnancy related conditions, unspecified trimester: Secondary | ICD-10-CM

## 2020-10-07 MED ORDER — COMFORT FIT MATERNITY SUPP SM MISC: 1.0000 [IU] | Freq: Every day | 0 refills | Status: DC | PRN

## 2020-10-07 NOTE — Patient Instructions (Addendum)
 PREGNANCY SUPPORT BELT: You are not alone, Seventy-five percent of women have some sort of abdominal or back pain at some point in their pregnancy. Your baby is growing at a fast pace, which means that your whole body is rapidly trying to adjust to the changes. As your uterus grows, your back may start feeling a bit under stress and this can result in back or abdominal pain that can go from mild, and therefore bearable, to severe pains that will not allow you to sit or lay down comfortably, When it comes to dealing with pregnancy-related pains and cramps, some pregnant women usually prefer natural remedies, which the market is filled with nowadays. For example, wearing a pregnancy support belt can help ease and lessen your discomfort and pain. WHAT ARE THE BENEFITS OF WEARING A PREGNANCY SUPPORT BELT? A pregnancy support belt provides support to the lower portion of the belly taking some of the weight of the growing uterus and distributing to the other parts of your body. It is designed make you comfortable and gives you extra support. Over the years, the pregnancy apparel market has been studying the needs and wants of pregnant women and they have come up with the most comfortable pregnancy support belts that woman could ever ask for. In fact, you will no longer have to wear a stretched-out or bulky pregnancy belt that is visible underneath your clothes and makes you feel even more uncomfortable. Nowadays, a pregnancy support belt is made of comfortable and stretchy materials that will not irritate your skin but will actually make you feel at ease and you will not even notice you are wearing it. They are easy to put on and adjust during the day and can be worn at night for additional support.  BENEFITS: . Relives Back pain . Relieves Abdominal Muscle and Leg Pain . Stabilizes the Pelvic Ring . Offers a Cushioned Abdominal Lift Pad . Relieves pressure on the Sciatic Nerve Within Minutes WHERE TO GET  YOUR PREGNANCY BELT:  [facility]  [address] [place], [place] [address]  [phone]  [email or web address]   Glucose Tolerance Test During Pregnancy Why am I having this test? The glucose tolerance test (GTT) is done to check how your body processes sugar (glucose). This is one of several tests used to diagnose diabetes that develops during pregnancy (gestational diabetes mellitus). Gestational diabetes is a temporary form of diabetes that some women develop during pregnancy. It usually occurs during the second trimester of pregnancy and goes away after delivery. Testing (screening) for gestational diabetes usually occurs between 24 and 28 weeks of pregnancy. You may have the GTT test after having a 1-hour glucose screening test if the results from that test indicate that you may have gestational diabetes. You may also have this test if:  You have a history of gestational diabetes.  You have a history of giving birth to very large babies or have experienced repeated fetal loss (stillbirth).  You have signs and symptoms of diabetes, such as: ? Changes in your vision. ? Tingling or numbness in your hands or feet. ? Changes in hunger, thirst, and urination that are not otherwise explained by your pregnancy. What is being tested? This test measures the amount of glucose in your blood at different times during a period of 3 hours. This indicates how well your body is able to process glucose. What kind of sample is taken?  Blood samples are required for this test. They are usually collected   by inserting a needle into a blood vessel. How do I prepare for this test?  For 3 days before your test, eat normally. Have plenty of carbohydrate-rich foods.  Follow instructions from your health care provider about: ? Eating or drinking restrictions on the day of the test. You may be asked to not eat or drink anything other than water (fast) starting 8-10 hours before the  test. ? Changing or stopping your regular medicines. Some medicines may interfere with this test. Tell a health care provider about:  All medicines you are taking, including vitamins, herbs, eye drops, creams, and over-the-counter medicines.  Any blood disorders you have.  Any surgeries you have had.  Any medical conditions you have. What happens during the test? First, your blood glucose will be measured. This is referred to as your fasting blood glucose, since you fasted before the test. Then, you will drink a glucose solution that contains a certain amount of glucose. Your blood glucose will be measured again 1, 2, and 3 hours after drinking the solution. This test takes about 3 hours to complete. You will need to stay at the testing location during this time. During the testing period:  Do not eat or drink anything other than the glucose solution.  Do not exercise.  Do not use any products that contain nicotine or tobacco, such as cigarettes and e-cigarettes. If you need help stopping, ask your health care provider. The testing procedure may vary among health care providers and hospitals. How are the results reported? Your results will be reported as milligrams of glucose per deciliter of blood (mg/dL) or millimoles per liter (mmol/L). Your health care provider will compare your results to normal ranges that were established after testing a large group of people (reference ranges). Reference ranges may vary among labs and hospitals. For this test, common reference ranges are:  Fasting: less than 95-105 mg/dL (5.3-5.8 mmol/L).  1 hour after drinking glucose: less than 180-190 mg/dL (10.0-10.5 mmol/L).  2 hours after drinking glucose: less than 155-165 mg/dL (8.6-9.2 mmol/L).  3 hours after drinking glucose: 140-145 mg/dL (7.8-8.1 mmol/L). What do the results mean? Results within reference ranges are considered normal, meaning that your glucose levels are well-controlled. If two or  more of your blood glucose levels are high, you may be diagnosed with gestational diabetes. If only one level is high, your health care provider may suggest repeat testing or other tests to confirm a diagnosis. Talk with your health care provider about what your results mean. Questions to ask your health care provider Ask your health care provider, or the department that is doing the test:  When will my results be ready?  How will I get my results?  What are my treatment options?  What other tests do I need?  What are my next steps? Summary  The glucose tolerance test (GTT) is one of several tests used to diagnose diabetes that develops during pregnancy (gestational diabetes mellitus). Gestational diabetes is a temporary form of diabetes that some women develop during pregnancy.  You may have the GTT test after having a 1-hour glucose screening test if the results from that test indicate that you may have gestational diabetes. You may also have this test if you have any symptoms or risk factors for gestational diabetes.  Talk with your health care provider about what your results mean. This information is not intended to replace advice given to you by your health care provider. Make sure you discuss any questions you   have with your health care provider. Document Revised: 02/13/2019 Document Reviewed: 06/04/2017 Elsevier Patient Education  2020 Elsevier Inc.  

## 2020-10-07 NOTE — Progress Notes (Signed)
LOW-RISK PREGNANCY OFFICE VISIT Patient name: Allison Ferrell MRN [id number]  Date of birth: 2001/07/23 Chief Complaint:   Routine Prenatal Visit  History of Present Illness:   Allison Ferrell is a 19 y.o. G1P1001 female at [redacted]w[redacted]d with an Estimated Date of Delivery: 01/28/21 being seen today for ongoing management of a low-risk pregnancy.  Today she reports dysuria and pelvic pressure. Contractions: Not present. Vag. Bleeding: None.  Movement: Present. denies leaking of fluid. Review of Systems:   Pertinent items are noted in HPI Denies abnormal vaginal discharge w/ itching/odor/irritation, headaches, visual changes, shortness of breath, chest pain, abdominal pain, severe nausea/vomiting, or problems with urination or bowel movements unless otherwise stated above. Pertinent History Reviewed:  Reviewed past medical,surgical, social, obstetrical and family history.  Reviewed problem list, medications and allergies. Physical Assessment:   Vitals:   10/07/20 1537  BP: 120/67  Pulse: 94  Temp: 97.9 F (36.6 C)  Weight: 182 lb 3.2 oz (82.6 kg)  Body mass index is 31.27 kg/m.        Physical Examination:   General appearance: Well appearing, and in no distress  Mental status: Alert, oriented to person, place, and time  Skin: Warm & dry  Cardiovascular: Normal heart rate noted  Respiratory: Normal respiratory effort, no distress  Abdomen: Soft, gravid, nontender  Pelvic: Cervical exam deferred         Extremities: Edema: None  Fetal Status: Fetal Heart Rate (bpm): 160   Movement: Present    No results found for this or any previous visit (from the past 24 hour(s)).  Assessment & Plan:  1) Low-risk pregnancy G2P1001 at [redacted]w[redacted]d with an Estimated Date of Delivery: 01/28/21   2) Supervision of other normal pregnancy, antepartum - Anticipatory guidance for 2 hr GTT - advised to fast after midnight without anything to eat or drink (except for water), will have fasting blood  drawn, drink the glucola drink (flavor choices: orange, fruit punch or lemon-lime), have a visit with a provider during the first hour of testing, wait in the lab waiting room to have blood drawn at 1 hour and then 2 hours after finishing glucola drink.  3) Vaginal irritation  - Cervicovaginal ancillary only( [facility])  4) Dysuria during pregnancy, antepartum  - Culture, OB Urine  5) Pelvic pressure in pregnancy, antepartum, second trimester  - Elastic Bandages & Supports (COMFORT FIT MATERNITY SUPP SM) Misc. - Information provided on places to purchase maternity support belt   6) [redacted] weeks gestation of pregnancy     Meds:  Meds ordered this encounter  Medications  . Elastic Bandages & Supports (COMFORT FIT MATERNITY SUPP SM) MISC    Sig: 1 Units by Does not apply route daily as needed.    Dispense:  1 each    Refill:  0    Order Specific Question:   Supervising Provider    Answer:   Reva Bores [[id number]]   Labs/procedures today: blind swab & UCx  Plan:  Continue routine obstetrical care   Reviewed: Preterm labor symptoms and general obstetric precautions including but not limited to vaginal bleeding, contractions, leaking of fluid and fetal movement were reviewed in detail with the patient.  All questions were answered. Has home bp cuff. Check bp weekly, let us know if >140/90.   Follow-up: Return in about 4 weeks (around 11/04/2020) for Return OB 2hr GTT.  Orders Placed This Encounter  Procedures  . Culture, OB Urine   Raelyn Mora MSN, CNM 10/07/2020  3:46 PM

## 2020-10-08 LAB — CERVICOVAGINAL ANCILLARY ONLY
Bacterial Vaginitis (gardnerella): POSITIVE — AB
Candida Glabrata: NEGATIVE
Candida Vaginitis: POSITIVE — AB
Chlamydia: NEGATIVE
Comment: NEGATIVE
Comment: NEGATIVE
Comment: NEGATIVE
Comment: NEGATIVE
Comment: NEGATIVE
Comment: NORMAL
Neisseria Gonorrhea: NEGATIVE
Trichomonas: NEGATIVE

## 2020-10-09 LAB — CULTURE, OB URINE

## 2020-10-09 LAB — URINE CULTURE, OB REFLEX

## 2020-10-11 ENCOUNTER — Telehealth: Payer: Self-pay | Admitting: *Deleted

## 2020-10-11 DIAGNOSIS — B9689 Other specified bacterial agents as the cause of diseases classified elsewhere: Secondary | ICD-10-CM

## 2020-10-11 DIAGNOSIS — B3731 Acute candidiasis of vulva and vagina: Secondary | ICD-10-CM

## 2020-10-11 DIAGNOSIS — B373 Candidiasis of vulva and vagina: Secondary | ICD-10-CM

## 2020-10-11 DIAGNOSIS — N760 Acute vaginitis: Secondary | ICD-10-CM

## 2020-10-11 MED ORDER — TERCONAZOLE 0.4 % VA CREA: 1.0000 | TOPICAL_CREAM | Freq: Every day | VAGINAL | 0 refills | Status: DC

## 2020-10-11 MED ORDER — METRONIDAZOLE 500 MG PO TABS: 500.0000 mg | ORAL_TABLET | Freq: Two times a day (BID) | ORAL | 0 refills | Status: DC

## 2020-10-11 NOTE — Telephone Encounter (Signed)
-----   Message from Raelyn Mora, [place] sent at 10/10/2020  4:35 PM EST ----- Treat for BV & yeast

## 2020-10-20 ENCOUNTER — Encounter: Payer: Self-pay | Admitting: Certified Nurse Midwife

## 2020-11-02 ENCOUNTER — Encounter (HOSPITAL_COMMUNITY): Payer: Self-pay | Admitting: Family Medicine

## 2020-11-02 ENCOUNTER — Other Ambulatory Visit: Payer: Self-pay

## 2020-11-02 ENCOUNTER — Telehealth: Payer: Self-pay

## 2020-11-02 ENCOUNTER — Inpatient Hospital Stay (HOSPITAL_COMMUNITY)
Admission: AD | Admit: 2020-11-02 | Discharge: 2020-11-03 | Disposition: A | Discharge status: Home / Self Care | Payer: HRSA Program | Attending: Obstetrics & Gynecology | Admitting: Obstetrics & Gynecology

## 2020-11-02 DIAGNOSIS — Z3480 Encounter for supervision of other normal pregnancy, unspecified trimester: Secondary | ICD-10-CM

## 2020-11-02 DIAGNOSIS — Z3A27 27 weeks gestation of pregnancy: Secondary | ICD-10-CM

## 2020-11-02 DIAGNOSIS — R1030 Lower abdominal pain, unspecified: Secondary | ICD-10-CM | POA: Insufficient documentation

## 2020-11-02 DIAGNOSIS — O98512 Other viral diseases complicating pregnancy, second trimester: Secondary | ICD-10-CM | POA: Diagnosis not present

## 2020-11-02 DIAGNOSIS — M549 Dorsalgia, unspecified: Secondary | ICD-10-CM

## 2020-11-02 DIAGNOSIS — U071 COVID-19: Secondary | ICD-10-CM | POA: Diagnosis not present

## 2020-11-02 DIAGNOSIS — O26892 Other specified pregnancy related conditions, second trimester: Secondary | ICD-10-CM

## 2020-11-02 DIAGNOSIS — R109 Unspecified abdominal pain: Secondary | ICD-10-CM

## 2020-11-02 DIAGNOSIS — R519 Headache, unspecified: Secondary | ICD-10-CM | POA: Insufficient documentation

## 2020-11-02 DIAGNOSIS — M5459 Other low back pain: Secondary | ICD-10-CM | POA: Insufficient documentation

## 2020-11-02 DIAGNOSIS — Z7982 Long term (current) use of aspirin: Secondary | ICD-10-CM | POA: Insufficient documentation

## 2020-11-02 LAB — RESP PANEL BY RT-PCR (FLU A&B, COVID) ARPGX2
Influenza A by PCR: NEGATIVE
Influenza B by PCR: NEGATIVE
SARS Coronavirus 2 by RT PCR: POSITIVE — AB

## 2020-11-02 LAB — URINALYSIS, ROUTINE W REFLEX MICROSCOPIC
Bilirubin Urine: NEGATIVE
Glucose, UA: NEGATIVE mg/dL
Hgb urine dipstick: NEGATIVE
Ketones, ur: 80 mg/dL — AB
Nitrite: NEGATIVE
Protein, ur: 30 mg/dL — AB
Specific Gravity, Urine: 1.02 (ref 1.005–1.030)
pH: 5 (ref 5.0–8.0)

## 2020-11-02 MED ORDER — SODIUM CHLORIDE 0.9 % IV SOLN
INTRAVENOUS | Status: DC | PRN
  Administered 2020-11-02: 22:00:00 1000 mL via INTRAVENOUS

## 2020-11-02 MED ORDER — DIPHENHYDRAMINE HCL 50 MG/ML IJ SOLN: 50.0000 mg | Freq: Once | INTRAMUSCULAR | Status: DC | PRN

## 2020-11-02 MED ORDER — FAMOTIDINE 20 MG IN NS 100 ML IVPB: 20.0000 mg | Freq: Once | INTRAVENOUS | Status: DC | PRN

## 2020-11-02 MED ORDER — SODIUM CHLORIDE 0.9 % IV SOLN
Freq: Once | INTRAVENOUS | Status: AC
  Filled 2020-11-02: qty 5

## 2020-11-02 MED ORDER — CYCLOBENZAPRINE HCL 5 MG PO TABS
10.0000 mg | ORAL_TABLET | Freq: Once | ORAL | Status: AC
  Administered 2020-11-02: 18:00:00 10 mg via ORAL
  Filled 2020-11-02: qty 2

## 2020-11-02 MED ORDER — M.V.I. ADULT IV INJ
Freq: Once | INTRAVENOUS | Status: AC
  Filled 2020-11-02 (×2): qty 10

## 2020-11-02 MED ORDER — LACTATED RINGERS IV BOLUS
1000.0000 mL | Freq: Once | INTRAVENOUS | Status: AC
  Administered 2020-11-02: 19:00:00 1000 mL via INTRAVENOUS

## 2020-11-02 MED ORDER — ACETAMINOPHEN 325 MG PO TABS
650.0000 mg | ORAL_TABLET | Freq: Once | ORAL | Status: AC
  Administered 2020-11-02: 18:00:00 650 mg via ORAL
  Filled 2020-11-02: qty 2

## 2020-11-02 MED ORDER — SODIUM CHLORIDE 0.9 % IV SOLN
1200.0000 mg | Freq: Once | INTRAVENOUS | Status: DC
  Filled 2020-11-02: qty 10

## 2020-11-02 MED ORDER — ALBUTEROL SULFATE HFA 108 (90 BASE) MCG/ACT IN AERS: 2.0000 | INHALATION_SPRAY | Freq: Once | RESPIRATORY_TRACT | Status: DC | PRN

## 2020-11-02 MED ORDER — EPINEPHRINE 0.3 MG/0.3ML IJ SOAJ: 0.3000 mg | Freq: Once | INTRAMUSCULAR | Status: DC | PRN

## 2020-11-02 MED ORDER — METHYLPREDNISOLONE SODIUM SUCC 125 MG IJ SOLR: 125.0000 mg | Freq: Once | INTRAMUSCULAR | Status: DC | PRN

## 2020-11-02 NOTE — Telephone Encounter (Signed)
Patient called and cancelled her appt for 11/03/2020 at  8:30am due to son and patient getting sick as well. Patient stated that she will call back to reschedule.

## 2020-11-02 NOTE — MAU Provider Note (Addendum)
History     CSN: [id number]  Arrival date and time: 11/02/20 1703   Event Date/Time   First Provider Initiated Contact with Patient 11/02/20 1842      Chief Complaint  Patient presents with  . Abdominal Pain  . Back Pain   HPI Allison Ferrell is a 19 y.o. G2P1001 at [redacted]w[redacted]d who presents to MAU with chief complaints of lower abdominal pain and low back pain. These are new problems, onset today at 1400 hours.  Patient also endorses anterior bilateral headache pain, onset coinciding with onset of other complaints this afternoon. On CNM initial assessment she lists headache pain 7/10 as her only current complaint. She has not taken medication or tried other treatments for these complaints.  Patient endorses "having a cold" including occasional dry cough, new onset today. She denies SOB, chest pain, fever. She has not received a COVID vaccine and has not taken a COVID test.  She denies vaginal bleeding, leaking of fluid, decreased fetal movement, fever, falls, or recent illness.   She receives [facility] care [facility].  OB History    Gravida  2   Para  1   Term  1   Preterm      AB      Living  1     SAB      IAB      Ectopic      Multiple  0   Live Births  1           Past Medical History:  Diagnosis Date  . Anemia   . Chlamydia   . Infection    UTI  . Preeclampsia 2019    Past Surgical History:  Procedure Laterality Date  . NO PAST SURGERIES      Family History  Problem Relation Age of Onset  . Healthy Mother   . Healthy Father     Social History   Tobacco Use  . Smoking status: Never Smoker  . Smokeless tobacco: Never Used  Vaping Use  . Vaping Use: Never used  Substance Use Topics  . Alcohol use: Never  . Drug use: Never    Allergies: No Known Allergies  Medications Prior to Admission  Medication Sig Dispense Refill Last Dose  . aspirin (BAYER ASPIRIN EC LOW DOSE) 81 MG EC tablet Take 1 tablet (81 mg total) by  mouth daily. Swallow whole. 30 tablet 0 Past Month at Unknown time  . Prenat-Fe Poly-Methfol-FA-DHA (VITAFOL ULTRA) 29-0.6-0.4-200 MG CAPS Take 1 tablet by mouth daily. 33 capsule 0 11/01/2020 at Unknown time  . Doxylamine-Pyridoxine ER (BONJESTA) 20-20 MG TBCR Take 1 tablet by mouth at bedtime as needed. (Patient not taking: Reported on 09/22/2020) 30 tablet 0   . Elastic Bandages & Supports (COMFORT FIT MATERNITY SUPP SM) MISC 1 Units by Does not apply route daily as needed. 1 each 0 More than a month at Unknown time  . metroNIDAZOLE (FLAGYL) 500 MG tablet Take 1 tablet (500 mg total) by mouth 2 (two) times daily. Take with Food. 14 tablet 0   . Prenat-FeAsp-Meth-FA-DHA w/o A (PRENATE PIXIE) 10-0.6-0.4-200 MG CAPS Take 1 capsule by mouth daily. (Patient not taking: Reported on 09/22/2020) 30 capsule 11   . Prenatal Vit-Fe Fumarate-FA (PRENATAL MULTIVITAMIN) TABS tablet Take 1 tablet by mouth daily at 12 noon. (Patient not taking: Reported on 09/22/2020)     . terconazole (TERAZOL 7) 0.4 % vaginal cream Place 1 applicator vaginally at bedtime. 3 nights at bedtime. 45  g 0     Review of Systems  Gastrointestinal: Positive for abdominal pain.  Musculoskeletal: Positive for back pain.  All other systems reviewed and are negative.  Physical Exam   Blood pressure 106/73, pulse (!) 143, temperature 99.6 F (37.6 C), resp. rate 16, last menstrual period 04/23/2020, SpO2 100 %, unknown if currently breastfeeding.  Physical Exam Vitals and nursing note reviewed. Exam conducted with a chaperone present.  Constitutional:      Appearance: She is obese. She is not ill-appearing.  Cardiovascular:     Rate and Rhythm: Normal rate.     Heart sounds: Normal heart sounds.  Pulmonary:     Effort: Pulmonary effort is normal.     Breath sounds: Normal breath sounds.  Abdominal:     Palpations: Abdomen is soft.     Tenderness: There is no abdominal tenderness. There is no right CVA tenderness or left CVA  tenderness.  Skin:    General: Skin is warm and dry.     Capillary Refill: Capillary refill takes less than 2 seconds.  Neurological:     Mental Status: She is alert and oriented to person, place, and time.  Psychiatric:        Mood and Affect: Mood normal.     MAU Course  Procedures  --Reactive tracing: baseline 140, mod var, + 15 x 15 accels, no decels --Toco: quiet --All pain resolved with treatments administered in MAU --Ketonuria, LR bolus and MVI infusion advised --COVID +, asymptomatic in MAU, lungs CTAB, MAB infusion advised, pt agreeable  Orders Placed This Encounter  Procedures  . Resp Panel by RT-PCR (Flu A&B, Covid) Nasopharyngeal Swab  . Urinalysis, Routine w reflex microscopic Urine, Clean Catch  . Hypersensitivity GRADE 1: Transient flushing or rash, or drug fever < 100.4 F  . Hypersensitivity GRADE 2: Rash, flushing, urticaria, dyspnea, or drug fever = or > 100.4 F  . Hypersensitivity GRADE 3: symptomatic bronchospasm, with or without urticaria, parenteral medication management indicated, allergy-related edema/angioedema, or hypotension  . Hypersensitivity GRADE 4: Anaphylaxis  . Fetal monitoring  . Assess fetal heart tones  . Provide patient the appropriate monoclonal antibody fact sheet prior to administration  . casirivimab / imdevimab per pharmacy consult  . Airborne and Contact precautions  . Insert peripheral IV   Meds ordered this encounter  Medications  . acetaminophen (TYLENOL) tablet 650 mg  . cyclobenzaprine (FLEXERIL) tablet 10 mg  . lactated ringers bolus 1,000 mL  . multivitamins adult (INFUVITE ADULT) 10 mL in lactated ringers 1,000 mL infusion  . DISCONTD: casirivimab-imdevimab (REGEN-COV) 1,200 mg in sodium chloride 0.9 % 110 mL IVPB    Order Specific Question:   I attest that this patient meets the FDA Emergency Use Authorization criteria and has risk factor(s) for progression to severe COVID-19 and/or hospitalization:    Answer:   Yes     Order Specific Question:   High Risk Factor(s) for COVID-19 Progression:    Answer:   Pregnancy    Order Specific Question:   High Risk Factor(s) for COVID-19 Progression:    Answer:   BMI > 25  . 0.9 %  sodium chloride infusion  . diphenhydrAMINE (BENADRYL) injection 50 mg  . famotidine (PEPCID) IVPB 20 mg in NS 100 mL IVPB  . methylPREDNISolone sodium succinate (SOLU-MEDROL) 125 mg/2 mL injection 125 mg  . albuterol (VENTOLIN HFA) 108 (90 Base) MCG/ACT inhaler 2 puff  . EPINEPHrine (EPI-PEN) injection 0.3 mg  . casirivimab (REGN 10933) 600 mg, imdevimab (  REGN 10987) 600 mg in sodium chloride 0.9 % 110 mL IVPB    Order Specific Question:   I attest that this patient meets the FDA Emergency Use Authorization criteria and has risk factor(s) for progression to severe COVID-19 and/or hospitalization:    Answer:   Yes    Order Specific Question:   High Risk Factor(s) for COVID-19 Progression:    Answer:   Pregnancy    Order Specific Question:   High Risk Factor(s) for COVID-19 Progression:    Answer:   BMI > 25   Report given to V. Aundria Rud, CNM who assumes care of patient at this time.  Clayton Bibles, MSN, CNM Certified Nurse Midwife, [facility] for [employer], [facility] 11/02/20 8:17 PM    MAB infusion completed, vital signs stable  Encouraged patient to quarantine for 10-14 days, discussed with patient to notify family/friends that she has been around so precautions can be taken and they can be tested. Discussed with patient that next prenatal appointment will be virtual and to call to have it scheduled.   Discussed reasons to return to MAU. Pt stable at time of discharge.   Assessment and Plan   1. COVID-19 affecting pregnancy in second trimester   2. [redacted] weeks gestation of pregnancy   3. Abdominal pain during pregnancy in second trimester   4. Back pain during pregnancy in second trimester   5. Supervision of other normal pregnancy,  antepartum    Discharge home COVID quarantine  Return to MAU as needed for reasons discussed and/or emergencies  Hydration and FKC    Follow-up Information    [facility]. Schedule an appointment as soon as possible for a visit.   Specialty: Obstetrics and Gynecology Why: Make next prenatal appointment to be virtual appointment through MyChart Contact information: [address] [place] [facility] [place] [address] [phone]             Allergies as of 11/03/2020   No Known Allergies     Medication List    STOP taking these medications   metroNIDAZOLE 500 MG tablet Commonly known as: FLAGYL   terconazole 0.4 % vaginal cream Commonly known as: TERAZOL 7     TAKE these medications   aspirin 81 MG EC tablet Commonly known as: Bayer Aspirin EC Low Dose Take 1 tablet (81 mg total) by mouth daily. Swallow whole.   Bonjesta 20-20 MG Tbcr Generic drug: Doxylamine-Pyridoxine ER Take 1 tablet by mouth at bedtime as needed.   Comfort Fit Maternity Supp Sm Misc 1 Units by Does not apply route daily as needed.   prenatal multivitamin Tabs tablet Take 1 tablet by mouth daily at 12 noon.   Prenate Pixie 10-0.6-0.4-200 MG Caps Take 1 capsule by mouth daily.   Vitafol Ultra 29-0.6-0.4-200 MG Caps Take 1 tablet by mouth daily.      Sharyon Cable, CNM 11/03/20, 12:25 AM

## 2020-11-02 NOTE — Discharge Instructions (Signed)
10 Things You Can Do to Manage Your COVID-19 Symptoms at Home If you have possible or confirmed COVID-19: 1. Stay home from work and school. And stay away from other public places. If you must go out, avoid using any kind of public transportation, ridesharing, or taxis. 2. Monitor your symptoms carefully. If your symptoms get worse, call your healthcare provider immediately. 3. Get rest and stay hydrated. 4. If you have a medical appointment, call the healthcare provider ahead of time and tell them that you have or may have COVID-19. 5. For medical emergencies, call 911 and notify the dispatch personnel that you have or may have COVID-19. 6. Cover your cough and sneezes with a tissue or use the inside of your elbow. 7. Wash your hands often with soap and water for at least 20 seconds or clean your hands with an alcohol-based hand sanitizer that contains at least 60% alcohol. 8. As much as possible, stay in a specific room and away from other people in your home. Also, you should use a separate bathroom, if available. If you need to be around other people in or outside of the home, wear a mask. 9. Avoid sharing personal items with other people in your household, like dishes, towels, and bedding. 10. Clean all surfaces that are touched often, like counters, tabletops, and doorknobs. Use household cleaning sprays or wipes according to the label instructions. cdc.gov/coronavirus 05/07/2019 This information is not intended to replace advice given to you by your health care provider. Make sure you discuss any questions you have with your health care provider. Document Revised: 10/09/2019 Document Reviewed: 10/09/2019 Elsevier Patient Education  2020 Elsevier Inc.  

## 2020-11-02 NOTE — MAU Note (Signed)
Pt reports around 1400 today that she started having constant lower abdominal and back pain. Pt reports legs feeling numb.   Pt also reports new cough and headache that started today.  Denies vaginal bleeding or LOF.   Reports +FM

## 2020-11-03 ENCOUNTER — Encounter: Payer: Self-pay | Admitting: Advanced Practice Midwife

## 2020-11-06 NOTE — L&D Delivery Note (Addendum)
OB Delivery Note Allison Ferrell is a 20 y.o. s/p vaginal delivery at [redacted]w[redacted]d.  She was admitted for IOL due to elevated BP.   ROM: 0h 33m with clear fluid GBS Status: positive, adequately treated w/PCN Maximum Maternal Temperature: 98.65F   Labor Progress: Pt presented for IOL due to elevated BP. Her cervix was favorable on admission, so FB was placed and she received buccal cytotec x1. After FB came out, pitocin was initiated and titrated up. AROM was performed for clear fluid. Pt was then noted to have complete cervical dilation and delivered without complication as noted below.   Delivery Date/Time: 01/26/2021 at 1135 Delivery: Called to room and patient was complete and pushing. Head delivered ROA. Loose nuchal cord present x1. Shoulder and body delivered in usual fashion. Infant with spontaneous cry, placed on mother's abdomen, dried and stimulated. Cord clamped x 2 after 1-minute delay, and cut under my direct supervision. Cord blood drawn. Placenta delivered spontaneously with gentle cord traction. Fundus firm with massage and Pitocin. Labia, perineum, vagina, and cervix were inspected; no lacerations visualized.    Placenta: intact, 3-vessel cord, sent to L&D Complications: none Lacerations: none EBL: 100 ml Analgesia: epidural   Infant: female  APGARs 9 & 9  weight per medical record  Maury Dus, MD   I was present and gloved for entirety of delivery. I agree with the findings and the plan of care as documented in the resident's note.  Casper Harrison, MD [facility] Family Medicine Fellow, [facility] for [facility], [facility]

## 2020-11-18 ENCOUNTER — Ambulatory Visit (INDEPENDENT_AMBULATORY_CARE_PROVIDER_SITE_OTHER): Payer: Self-pay | Admitting: Obstetrics and Gynecology

## 2020-11-18 ENCOUNTER — Other Ambulatory Visit: Payer: Self-pay

## 2020-11-18 VITALS — BP 107/62 | HR 84 | Temp 98.2°F | Wt 183.6 lb

## 2020-11-18 DIAGNOSIS — Z3480 Encounter for supervision of other normal pregnancy, unspecified trimester: Secondary | ICD-10-CM

## 2020-11-18 DIAGNOSIS — Z3A29 29 weeks gestation of pregnancy: Secondary | ICD-10-CM

## 2020-11-18 NOTE — Progress Notes (Signed)
   LOW-RISK PREGNANCY OFFICE VISIT Patient name: Allison Ferrell MRN [id number]  Date of birth: October 20, 2001 Chief Complaint:   Routine Prenatal Visit  History of Present Illness:   Allison Ferrell is a 20 y.o. G55P1001 female at [redacted]w[redacted]d with an Estimated Date of Delivery: 01/28/21 being seen today for ongoing management of a low-risk pregnancy.  Today she reports soreness around belly button and white vaginal d/c; no itching or burning. Contractions: Not present. Vag. Bleeding: None.  Movement: Present. denies leaking of fluid. Review of Systems:   Pertinent items are noted in HPI Denies abnormal vaginal discharge w/ itching/odor/irritation, headaches, visual changes, shortness of breath, chest pain, abdominal pain, severe nausea/vomiting, or problems with urination or bowel movements unless otherwise stated above. Pertinent History Reviewed:  Reviewed past medical,surgical, social, obstetrical and family history.  Reviewed problem list, medications and allergies. Physical Assessment:   Vitals:   11/18/20 0841  BP: 107/62  Pulse: 84  Temp: 98.2 F (36.8 C)  Weight: 183 lb 9.6 oz (83.3 kg)  Body mass index is 31.51 kg/m.        Physical Examination:   General appearance: Well appearing, and in no distress  Mental status: Alert, oriented to person, place, and time  Skin: Warm & dry  Cardiovascular: Normal heart rate noted  Respiratory: Normal respiratory effort, no distress  Abdomen: Soft, gravid, nontender  Pelvic: Cervical exam deferred         Extremities: Edema: None  Fetal Status: Fetal Heart Rate (bpm): 152   Movement: Present    No results found for this or any previous visit (from the past 24 hour(s)).  Assessment & Plan:  1) Low-risk pregnancy G2P1001 at [redacted]w[redacted]d with an Estimated Date of Delivery: 01/28/21   2) Supervision of other normal pregnancy, antepartum  - Glucose Tolerance, 2 Hours w/1 Hour,  - HIV Antibody (routine testing w rflx),  - RPR,  -  CBC  3) [redacted] weeks gestation of pregnancy    Meds: No orders of the defined types were placed in this encounter.  Labs/procedures today: none  Plan:  Continue routine obstetrical care   Reviewed: Preterm labor symptoms and general obstetric precautions including but not limited to vaginal bleeding, contractions, leaking of fluid and fetal movement were reviewed in detail with the patient.  All questions were answered. Has home bp cuff. Check bp weekly, let us know if >140/90.   Follow-up: Return in about 3 weeks (around 12/09/2020) for Return OB - My Chart video.  Orders Placed This Encounter  Procedures  . Glucose Tolerance, 2 Hours w/1 Hour  . HIV Antibody (routine testing w rflx)  . RPR  . CBC   Raelyn Mora MSN, CNM 11/18/2020 10:18 AM

## 2020-11-19 LAB — CBC
Hematocrit: 30 % — ABNORMAL LOW (ref 34.0–46.6)
Hemoglobin: 10.2 g/dL — ABNORMAL LOW (ref 11.1–15.9)
MCH: 29.2 pg (ref 26.6–33.0)
MCHC: 34 g/dL (ref 31.5–35.7)
MCV: 86 fL (ref 79–97)
Platelets: 418 10*3/uL (ref 150–450)
RBC: 3.49 x10E6/uL — ABNORMAL LOW (ref 3.77–5.28)
RDW: 13.1 % (ref 11.7–15.4)
WBC: 9.5 10*3/uL (ref 3.4–10.8)

## 2020-11-19 LAB — GLUCOSE TOLERANCE, 2 HOURS W/ 1HR
Glucose, 1 hour: 175 mg/dL (ref 65–179)
Glucose, 2 hour: 122 mg/dL (ref 65–152)
Glucose, Fasting: 86 mg/dL (ref 65–91)

## 2020-11-19 LAB — HIV ANTIBODY (ROUTINE TESTING W REFLEX): HIV Screen 4th Generation wRfx: NONREACTIVE

## 2020-11-19 LAB — RPR: RPR Ser Ql: NONREACTIVE

## 2020-11-29 ENCOUNTER — Telehealth: Payer: Self-pay | Admitting: *Deleted

## 2020-11-29 DIAGNOSIS — O99019 Anemia complicating pregnancy, unspecified trimester: Secondary | ICD-10-CM

## 2020-11-29 DIAGNOSIS — Z3480 Encounter for supervision of other normal pregnancy, unspecified trimester: Secondary | ICD-10-CM

## 2020-11-29 MED ORDER — ASCORBIC ACID 500 MG PO TABS: 500.0000 mg | ORAL_TABLET | ORAL | 3 refills | Status: DC

## 2020-11-29 MED ORDER — FERROUS SULFATE 325 (65 FE) MG PO TABS: 325.0000 mg | ORAL_TABLET | ORAL | 3 refills | Status: DC

## 2020-11-29 NOTE — Telephone Encounter (Signed)
-----   Message from [place], [place] sent at 11/26/2020 11:42 PM EST ----- Start on FeSO4 325 mg every other day with Vitamin C 500 mg every other day

## 2020-12-07 ENCOUNTER — Other Ambulatory Visit: Payer: Self-pay | Admitting: *Deleted

## 2020-12-07 DIAGNOSIS — Z8759 Personal history of other complications of pregnancy, childbirth and the puerperium: Secondary | ICD-10-CM

## 2020-12-07 DIAGNOSIS — Z3480 Encounter for supervision of other normal pregnancy, unspecified trimester: Secondary | ICD-10-CM

## 2020-12-07 MED ORDER — ASPIRIN 81 MG PO TBEC: 81.0000 mg | DELAYED_RELEASE_TABLET | Freq: Every day | ORAL | 2 refills | Status: DC

## 2020-12-11 ENCOUNTER — Other Ambulatory Visit: Payer: Self-pay

## 2020-12-11 ENCOUNTER — Inpatient Hospital Stay (HOSPITAL_COMMUNITY)
Admission: AD | Admit: 2020-12-11 | Discharge: 2020-12-11 | Disposition: A | Discharge status: Home / Self Care | Payer: Self-pay | Attending: Obstetrics and Gynecology | Admitting: Obstetrics and Gynecology

## 2020-12-11 ENCOUNTER — Encounter (HOSPITAL_COMMUNITY): Payer: Self-pay | Admitting: Obstetrics and Gynecology

## 2020-12-11 DIAGNOSIS — N760 Acute vaginitis: Secondary | ICD-10-CM

## 2020-12-11 DIAGNOSIS — O23593 Infection of other part of genital tract in pregnancy, third trimester: Secondary | ICD-10-CM | POA: Insufficient documentation

## 2020-12-11 DIAGNOSIS — Z7982 Long term (current) use of aspirin: Secondary | ICD-10-CM | POA: Insufficient documentation

## 2020-12-11 DIAGNOSIS — B9689 Other specified bacterial agents as the cause of diseases classified elsewhere: Secondary | ICD-10-CM | POA: Insufficient documentation

## 2020-12-11 DIAGNOSIS — R109 Unspecified abdominal pain: Secondary | ICD-10-CM | POA: Insufficient documentation

## 2020-12-11 DIAGNOSIS — M549 Dorsalgia, unspecified: Secondary | ICD-10-CM | POA: Insufficient documentation

## 2020-12-11 DIAGNOSIS — M545 Low back pain, unspecified: Secondary | ICD-10-CM

## 2020-12-11 DIAGNOSIS — R1030 Lower abdominal pain, unspecified: Secondary | ICD-10-CM

## 2020-12-11 DIAGNOSIS — Z3689 Encounter for other specified antenatal screening: Secondary | ICD-10-CM

## 2020-12-11 DIAGNOSIS — Z3A33 33 weeks gestation of pregnancy: Secondary | ICD-10-CM | POA: Insufficient documentation

## 2020-12-11 DIAGNOSIS — O99891 Other specified diseases and conditions complicating pregnancy: Secondary | ICD-10-CM | POA: Insufficient documentation

## 2020-12-11 DIAGNOSIS — O26893 Other specified pregnancy related conditions, third trimester: Secondary | ICD-10-CM | POA: Insufficient documentation

## 2020-12-11 LAB — URINALYSIS, ROUTINE W REFLEX MICROSCOPIC
Bilirubin Urine: NEGATIVE
Glucose, UA: >=500 mg/dL — AB
Hgb urine dipstick: NEGATIVE
Ketones, ur: NEGATIVE mg/dL
Nitrite: NEGATIVE
Protein, ur: NEGATIVE mg/dL
Specific Gravity, Urine: 1.011 (ref 1.005–1.030)
pH: 5 (ref 5.0–8.0)

## 2020-12-11 LAB — WET PREP, GENITAL
Sperm: NONE SEEN
Trich, Wet Prep: NONE SEEN
Yeast Wet Prep HPF POC: NONE SEEN

## 2020-12-11 NOTE — MAU Note (Signed)
Patient reports lower back and abdominal pain since Tuesday evening.  Also started feeling some possible leaking last night at 0030 but none since.  Denies VB.  + FM.

## 2020-12-11 NOTE — MAU Provider Note (Signed)
History     CSN: [id number]  Arrival date and time: 12/11/20 7782   Event Date/Time   First Provider Initiated Contact with Patient 12/11/20 0115      Chief Complaint  Patient presents with  . Abdominal Pain  . Back Pain   Allison Ferrell is a 20 y.o. G2P1001 at [redacted]w[redacted]d who receives care at [facility].  She presents today for Abdominal Pain and Back Pain.  She states she has been having back and lower abdominal pain since Tuesday.  However, she reports the pain has gotten stronger today.  She describes the pain as "tightness" that is constant.  She also reports pelvic pressure.  Patient denies any aggravating or relieving factors for the pain and rates it a 7/10.  She reports sexual activity yesterday. She endorses fetal movement and denies vaginal concerns including discharge, leaking, or bleeding.  She reports some occasional contractions.     OB History    Gravida  2   Para  1   Term  1   Preterm      AB      Living  1     SAB      IAB      Ectopic      Multiple  0   Live Births  1           Past Medical History:  Diagnosis Date  . Anemia   . Chlamydia   . Infection    UTI  . Preeclampsia 2019    Past Surgical History:  Procedure Laterality Date  . NO PAST SURGERIES      Family History  Problem Relation Age of Onset  . Healthy Mother   . Healthy Father     Social History   Tobacco Use  . Smoking status: Never Smoker  . Smokeless tobacco: Never Used  Vaping Use  . Vaping Use: Never used  Substance Use Topics  . Alcohol use: Never  . Drug use: Never    Allergies: No Known Allergies  Medications Prior to Admission  Medication Sig Dispense Refill Last Dose  . ascorbic acid (VITAMIN C) 500 MG tablet Take 1 tablet (500 mg total) by mouth every other day. Take with iron tablet 30 tablet 3   . aspirin (BAYER ASPIRIN EC LOW DOSE) 81 MG EC tablet Take 1 tablet (81 mg total) by mouth daily. Swallow whole. 30 tablet 2   .  Doxylamine-Pyridoxine ER (BONJESTA) 20-20 MG TBCR Take 1 tablet by mouth at bedtime as needed. (Patient not taking: Reported on 09/22/2020) 30 tablet 0   . Elastic Bandages & Supports (COMFORT FIT MATERNITY SUPP SM) MISC 1 Units by Does not apply route daily as needed. 1 each 0   . ferrous sulfate (FERROUSUL) 325 (65 FE) MG tablet Take 1 tablet (325 mg total) by mouth every other day. Take with Vitamin C tablet 30 tablet 3   . Prenat-Fe Poly-Methfol-FA-DHA (VITAFOL ULTRA) 29-0.6-0.4-200 MG CAPS Take 1 tablet by mouth daily. 33 capsule 0   . Prenat-FeAsp-Meth-FA-DHA w/o A (PRENATE PIXIE) 10-0.6-0.4-200 MG CAPS Take 1 capsule by mouth daily. (Patient not taking: No sig reported) 30 capsule 11   . Prenatal Vit-Fe Fumarate-FA (PRENATAL MULTIVITAMIN) TABS tablet Take 1 tablet by mouth daily at 12 noon. (Patient not taking: No sig reported)       Review of Systems  Gastrointestinal: Positive for abdominal pain. Negative for nausea and vomiting.  Genitourinary: Positive for pelvic pain. Negative for difficulty urinating, dyspareunia,  dysuria, vaginal bleeding and vaginal discharge.  Musculoskeletal: Positive for back pain.  Neurological: Negative for dizziness, light-headedness and headaches.   Physical Exam   Blood pressure 107/78, pulse (!) 101, temperature 98.4 F (36.9 C), resp. rate 19, weight 88 kg, last menstrual period 04/23/2020, unknown if currently breastfeeding.  Physical Exam Vitals reviewed. Exam conducted with a chaperone present.  Constitutional:      Appearance: Normal appearance. She is well-developed.  HENT:     Head: Normocephalic and atraumatic.  Eyes:     Conjunctiva/sclera: Conjunctivae normal.  Cardiovascular:     Rate and Rhythm: Regular rhythm.     Heart sounds: Normal heart sounds.  Pulmonary:     Effort: Pulmonary effort is normal. No respiratory distress.     Breath sounds: Normal breath sounds.  Abdominal:     General: Bowel sounds are normal.     Tenderness:  There is no abdominal tenderness.     Comments: Gravid  Genitourinary:    Vagina: Vaginal discharge ( Moderate amt frothy gray) present. No bleeding.     Cervix: No cervical motion tenderness, discharge or friability.     Comments: Speculum Exam: -Normal External Genitalia: Non tender, no apparent discharge at introitus.  -Vaginal Vault: Pink mucosa with good rugae. Moderate amt frothy grayish white discharge -wet prep collected -Cervix:Pink, no lesions, cysts, or polyps.  Appears closed. No active bleeding from os-GC/CT collected -Bimanual Exam: Dilation: Closed Effacement (%): Thick Station: Ballotable Exam by:: Gerrit Heck, CNM  Musculoskeletal:     Cervical back: Normal range of motion.  Skin:    General: Skin is warm and dry.  Neurological:     Mental Status: She is alert and oriented to person, place, and time.  Psychiatric:        Mood and Affect: Mood normal.        Behavior: Behavior normal.        Thought Content: Thought content normal.     Fetal Assessment 135 bpm, Mod Var, -Decels, +Accels Toco: Occasional  MAU Course   Results for orders placed or performed during the hospital encounter of 12/11/20 (from the past 24 hour(s))  Urinalysis, Routine w reflex microscopic Urine, Clean Catch     Status: Abnormal   Collection Time: 12/11/20 12:44 AM  Result Value Ref Range   Color, Urine YELLOW YELLOW   APPearance HAZY (A) CLEAR   Specific Gravity, Urine 1.011 1.005 - 1.030   pH 5.0 5.0 - 8.0   Glucose, UA >=500 (A) NEGATIVE mg/dL   Hgb urine dipstick NEGATIVE NEGATIVE   Bilirubin Urine NEGATIVE NEGATIVE   Ketones, ur NEGATIVE NEGATIVE mg/dL   Protein, ur NEGATIVE NEGATIVE mg/dL   Nitrite NEGATIVE NEGATIVE   Leukocytes,Ua SMALL (A) NEGATIVE   RBC / HPF 0-5 0 - 5 RBC/hpf   WBC, UA 6-10 0 - 5 WBC/hpf   Bacteria, UA RARE (A) NONE SEEN   Squamous Epithelial / LPF 11-20 0 - 5   Mucus PRESENT   Wet prep, genital     Status: Abnormal   Collection Time: 12/11/20   1:44 AM   Specimen: PATH Cytology Cervicovaginal Ancillary Only  Result Value Ref Range   Yeast Wet Prep HPF POC NONE SEEN NONE SEEN   Trich, Wet Prep NONE SEEN NONE SEEN   Clue Cells Wet Prep HPF POC PRESENT (A) NONE SEEN   WBC, Wet Prep HPF POC MODERATE (A) NONE SEEN   Sperm NONE SEEN    No results found.  MDM  PE Labs: UA, Wet Prep, CT/GC EFM Assessment and Plan  20 year old G2P1001  SIUP at 33.1weeks Cat I FT Back Pain Abdominal Pain  -POC reviewed -Exam performed and findings discussed. -Informed that findings are suspicious for BV. -Patient offered and declines pain medication. -NST Reactive. -Will await WP results.   Cherre Robins MSN, CNM 12/11/2020, 1:16 AM   Reassessment (2:25 AM)  -Wet prep returns positive for clue cells. -Provider to bedside to discuss results and treatment. -Patient reports treatment recently and still has metrogel script. -Patient confirms that she has enough for 5 day treatment. -Discussed usage of maternity belt to decrease back pain symptoms.  Patient states she uses, but experiences discomfort.  -Discussed usage of tylenol or warm compresses for relief of symptoms. -Patient questions safety of yoga and informed that prenatal yoga is appropriate and may relief some back pain. -Instructed to keep next appt as scheduled. -Encouraged to call or return to MAU if symptoms worsen or with the onset of new symptoms. -Discharged to home in stable condition.  Cherre Robins MSN, CNM Advanced Practice Provider, [facility]

## 2020-12-11 NOTE — Discharge Instructions (Signed)
Back Pain in Pregnancy Back pain during pregnancy is common. Back pain may be caused by several factors that are related to changes during your pregnancy. Follow these instructions at home: Managing pain, stiffness, and swelling  If directed, for sudden (acute) back pain, put ice on the painful area. ? Put ice in a plastic bag. ? Place a towel between your skin and the bag. ? Leave the ice on for 20 minutes, 2-3 times per day.  If directed, apply heat to the affected area before you exercise. Use the heat source that your health care provider recommends, such as a moist heat pack or a heating pad. ? Place a towel between your skin and the heat source. ? Leave the heat on for 20-30 minutes. ? Remove the heat if your skin turns bright red. This is especially important if you are unable to feel pain, heat, or cold. You may have a greater risk of getting burned.  If directed, massage the affected area.      Activity  Exercise as told by your health care provider. Gentle exercise is the best way to prevent or manage back pain.  Listen to your body when lifting. If lifting hurts, ask for help or bend your knees. This uses your leg muscles instead of your back muscles.  Squat down when picking up something from the floor. Do not bend over.  Only use bed rest for short periods as told by your health care provider. Bed rest should only be used for the most severe episodes of back pain. Standing, sitting, and lying down  Do not stand in one place for long periods of time.  Use good posture when sitting. Make sure your head rests over your shoulders and is not hanging forward. Use a pillow on your lower back if necessary.  Try sleeping on your side, preferably the left side, with a pregnancy support pillow or 1-2 regular pillows between your legs. ? If you have back pain after a night's rest, your bed may be too soft. ? A firm mattress may provide more support for your back during  pregnancy. General instructions  Do not wear high heels.  Eat a healthy diet. Try to gain weight within your health care provider's recommendations.  Use a maternity girdle, elastic sling, or back brace as told by your health care provider.  Take over-the-counter and prescription medicines only as told by your health care provider.  Work with a physical therapist or massage therapist to find ways to manage back pain. Acupuncture or massage therapy may be helpful.  Keep all follow-up visits as told by your health care provider. This is important. Contact a health care provider if:  Your back pain interferes with your daily activities.  You have increasing pain in other parts of your body. Get help right away if:  You develop numbness, tingling, weakness, or problems with the use of your arms or legs.  You develop severe back pain that is not controlled with medicine.  You have a change in bowel or bladder control.  You develop shortness of breath, dizziness, or you faint.  You develop nausea, vomiting, or sweating.  You have back pain that is a rhythmic, cramping pain similar to labor pains. Labor pain is usually 1-2 minutes apart, lasts for about 1 minute, and involves a bearing down feeling or pressure in your pelvis.  You have back pain and your water breaks or you have vaginal bleeding.  You have back pain or   numbness that travels down your leg.  Your back pain developed after you fell.  You develop pain on one side of your back.  You see blood in your urine.  You develop skin blisters in the area of your back pain. Summary  Back pain may be caused by several factors that are related to changes during your pregnancy.  Follow instructions as told by your health care provider for managing pain, stiffness, and swelling.  Exercise as told by your health care provider. Gentle exercise is the best way to prevent or manage back pain.  Take over-the-counter and  prescription medicines only as told by your health care provider.  Keep all follow-up visits as told by your health care provider. This is important. This information is not intended to replace advice given to you by your health care provider. Make sure you discuss any questions you have with your health care provider. Document Revised: 02/11/2019 Document Reviewed: 04/10/2018 Elsevier Patient Education  2021 Elsevier Inc.  

## 2020-12-13 LAB — GC/CHLAMYDIA PROBE AMP (~~LOC~~) NOT AT ARMC
Chlamydia: NEGATIVE
Comment: NEGATIVE
Comment: NORMAL
Neisseria Gonorrhea: NEGATIVE

## 2020-12-16 ENCOUNTER — Telehealth (INDEPENDENT_AMBULATORY_CARE_PROVIDER_SITE_OTHER): Payer: Self-pay | Admitting: Obstetrics and Gynecology

## 2020-12-16 ENCOUNTER — Encounter: Payer: Self-pay | Admitting: Obstetrics and Gynecology

## 2020-12-16 VITALS — BP 124/76 | HR 82 | Wt 194.0 lb

## 2020-12-16 DIAGNOSIS — Z3A33 33 weeks gestation of pregnancy: Secondary | ICD-10-CM

## 2020-12-16 DIAGNOSIS — Z3480 Encounter for supervision of other normal pregnancy, unspecified trimester: Secondary | ICD-10-CM

## 2020-12-16 NOTE — Progress Notes (Signed)
   MY CHART VIDEO VIRTUAL OBSTETRICS VISIT ENCOUNTER NOTE  I connected with [name] on 12/16/20 at  9:30 AM EST by My Chart video at home and verified that I am speaking with the correct person using two identifiers. Provider located at [facility] for [employer] at [place].   I discussed the limitations, risks, security and privacy concerns of performing an evaluation and management service by My Chart video and the availability of in person appointments. I also discussed with the patient that there may be a patient responsible charge related to this service. The patient expressed understanding and agreed to proceed.  Subjective:  Allison Ferrell is a 20 y.o. G2P1001 at [redacted]w[redacted]d being followed for ongoing prenatal care.  She is currently monitored for the following issues for this low-risk pregnancy and has Supervision of other normal pregnancy, antepartum on their problem list.  Patient reports some pelvic pressure. Reports fetal movement. Denies any contractions, bleeding or leaking of fluid.   The following portions of the patient's history were reviewed and updated as appropriate: allergies, current medications, past family history, past medical history, past social history, past surgical history and problem list.   Objective:   General:  Alert, oriented and cooperative.   Mental Status: Normal mood and affect perceived. Normal judgment and thought content.  Rest of physical exam deferred due to type of encounter  BP 124/76   Pulse 82   Wt 194 lb (88 kg)   LMP 04/23/2020   BMI 33.30 kg/m  **Done by patient's own at home BP cuff and scale  Assessment and Plan:  Pregnancy: G2P1001 at [redacted]w[redacted]d  1. Supervision of other normal pregnancy, antepartum - Anticipatory guidance for GBS screening at 36 wks. Explained the test is important to be done at this time in pregnancy to ensure adequate treatment at the time of delivery. Explained that a positive result does not  mean any harm to her, but can be harmful to the baby. Meaning that if baby is exposed to the bacteria for too long without antibiotics, the bay has the potential to develop pneumonia, septicemia, or spinal meningitis and could end up in the NICU. Also, explained that a cervical exam may be performed at the time of testing to get a baseline cervical check and make sure there is no preterm cervical dilation.  2. [redacted] weeks gestation of pregnancy   Preterm labor symptoms and general obstetric precautions including but not limited to vaginal bleeding, contractions, leaking of fluid and fetal movement were reviewed in detail with the patient.  I discussed the assessment and treatment plan with the patient. The patient was provided an opportunity to ask questions and all were answered. The patient agreed with the plan and demonstrated an understanding of the instructions. The patient was advised to call back or seek an in-person office evaluation/go to MAU at [facility] for any urgent or concerning symptoms. Please refer to After Visit Summary for other counseling recommendations.   I provided 5 minutes of non-face-to-face time during this encounter. There was 5 minutes of chart review time spent prior to this encounter. Total time spent = 10 minutes.  Return in about 2 weeks (around 12/30/2020) for Return OB w/GBS.  Future Appointments  Date Time Provider Department Center  12/30/2020  9:10 AM Raelyn Mora, CNM [facility] None    Raelyn Mora, CNM [facility] [employer], [facility]

## 2020-12-16 NOTE — Patient Instructions (Signed)

## 2020-12-30 ENCOUNTER — Encounter: Payer: Self-pay | Admitting: Obstetrics and Gynecology

## 2021-01-05 ENCOUNTER — Other Ambulatory Visit: Payer: Self-pay

## 2021-01-05 ENCOUNTER — Ambulatory Visit (INDEPENDENT_AMBULATORY_CARE_PROVIDER_SITE_OTHER): Payer: Self-pay | Admitting: Advanced Practice Midwife

## 2021-01-05 ENCOUNTER — Other Ambulatory Visit (HOSPITAL_COMMUNITY)
Admission: RE | Admit: 2021-01-05 | Discharge: 2021-01-05 | Disposition: A | Discharge status: Home / Self Care | Payer: Self-pay | Source: Ambulatory Visit | Attending: Advanced Practice Midwife | Admitting: Advanced Practice Midwife

## 2021-01-05 ENCOUNTER — Encounter: Payer: Self-pay | Admitting: Advanced Practice Midwife

## 2021-01-05 VITALS — BP 125/73 | HR 79 | Temp 97.5°F | Wt 199.6 lb

## 2021-01-05 DIAGNOSIS — Z3A36 36 weeks gestation of pregnancy: Secondary | ICD-10-CM

## 2021-01-05 DIAGNOSIS — Z3480 Encounter for supervision of other normal pregnancy, unspecified trimester: Secondary | ICD-10-CM | POA: Insufficient documentation

## 2021-01-05 NOTE — Progress Notes (Signed)
   PRENATAL VISIT NOTE  Subjective:  Allison Ferrell is a 20 y.o. G2P1001 at [redacted]w[redacted]d being seen today for ongoing prenatal care.  She is currently monitored for the following issues for this low-risk pregnancy and has Supervision of other normal pregnancy, antepartum on their problem list.  Patient reports no complaints.  Contractions: Not present. Vag. Bleeding: None.  Movement: Present. Denies leaking of fluid.   The following portions of the patient's history were reviewed and updated as appropriate: allergies, current medications, past family history, past medical history, past social history, past surgical history and problem list.   Objective:   Vitals:   01/05/21 1420  BP: 125/73  Pulse: 79  Temp: (!) 97.5 F (36.4 C)  Weight: 199 lb 9.6 oz (90.5 kg)    Fetal Status: Fetal Heart Rate (bpm): 140 Fundal Height: 37 cm Movement: Present     General:  Alert, oriented and cooperative. Patient is in no acute distress.  Skin: Skin is warm and dry. No rash noted.   Cardiovascular: Normal heart rate noted  Respiratory: Normal respiratory effort, no problems with respiration noted  Abdomen: Soft, gravid, appropriate for gestational age.  Pain/Pressure: Present     Pelvic: Cervical exam performed in the presence of a chaperone Dilation: Closed Effacement (%): 20 Station: Ballotable  Extremities: Normal range of motion.  Edema: Trace  Mental Status: Normal mood and affect. Normal behavior. Normal judgment and thought content.   Assessment and Plan:  Pregnancy: G2P1001 at [redacted]w[redacted]d 1. Supervision of other normal pregnancy, antepartum - Culture, beta strep (group b only) - Cervicovaginal ancillary only( [place])  2. [redacted] weeks gestation of pregnancy   Term labor symptoms and general obstetric precautions including but not limited to vaginal bleeding, contractions, leaking of fluid and fetal movement were reviewed in detail with the patient. Please refer to After Visit Summary for  other counseling recommendations.   Return in about 1 week (around 01/12/2021).  No future appointments.  Thressa Sheller DNP, CNM  01/05/21  3:03 PM

## 2021-01-07 LAB — CERVICOVAGINAL ANCILLARY ONLY
Chlamydia: NEGATIVE
Comment: NEGATIVE
Comment: NEGATIVE
Comment: NORMAL
Neisseria Gonorrhea: NEGATIVE
Trichomonas: NEGATIVE

## 2021-01-08 LAB — CULTURE, BETA STREP (GROUP B ONLY): Strep Gp B Culture: POSITIVE — AB

## 2021-01-12 ENCOUNTER — Other Ambulatory Visit: Payer: Self-pay

## 2021-01-12 ENCOUNTER — Ambulatory Visit (INDEPENDENT_AMBULATORY_CARE_PROVIDER_SITE_OTHER): Payer: Self-pay | Admitting: Obstetrics and Gynecology

## 2021-01-12 VITALS — BP 126/72 | HR 70 | Temp 98.3°F | Wt 204.8 lb

## 2021-01-12 DIAGNOSIS — Z3A37 37 weeks gestation of pregnancy: Secondary | ICD-10-CM

## 2021-01-12 DIAGNOSIS — Z3480 Encounter for supervision of other normal pregnancy, unspecified trimester: Secondary | ICD-10-CM

## 2021-01-12 NOTE — Progress Notes (Signed)
   LOW-RISK PREGNANCY OFFICE VISIT Patient name: Allison Ferrell MRN [id number]  Date of birth: [birth year] Chief Complaint:   No chief complaint on file.  History of Present Illness:   Allison Ferrell is a 20 y.o. G66P1001 female at [redacted]w[redacted]d with an Estimated Date of Delivery: 01/28/21 being seen today for ongoing management of a low-risk pregnancy.  Today she reports no complaints. Contractions: Not present. Vag. Bleeding: None.  Movement: Present. denies leaking of fluid. Review of Systems:   Pertinent items are noted in HPI Denies abnormal vaginal discharge w/ itching/odor/irritation, headaches, visual changes, shortness of breath, chest pain, abdominal pain, severe nausea/vomiting, or problems with urination or bowel movements unless otherwise stated above. Pertinent History Reviewed:  Reviewed past medical,surgical, social, obstetrical and family history.  Reviewed problem list, medications and allergies. Physical Assessment:   Vitals:   01/12/21 1616  BP: 126/72  Pulse: 70  Temp: 98.3 F (36.8 C)  Weight: 204 lb 12.8 oz (92.9 kg)  Body mass index is 35.15 kg/m.        Physical Examination:   General appearance: Well appearing, and in no distress  Mental status: Alert, oriented to person, place, and time  Skin: Warm & dry  Cardiovascular: Normal heart rate noted  Respiratory: Normal respiratory effort, no distress  Abdomen: Soft, gravid, nontender  Pelvic: Cervical exam deferred         Extremities: Edema: Trace  Fetal Status: Fetal Heart Rate (bpm): 143   Movement: Present    No results found for this or any previous visit (from the past 24 hour(s)).  Assessment & Plan:  1) Low-risk pregnancy G2P1001 at [redacted]w[redacted]d with an Estimated Date of Delivery: 01/28/21   2) Supervision of other normal pregnancy, antepartum - Patient decided to have IOL scheduled at 40 wks -- will schedule at next visit  3) [redacted] weeks gestation of pregnancy     Meds: No orders of the  defined types were placed in this encounter.  Labs/procedures today: none  Plan:  Continue routine obstetrical care   Reviewed: Term labor symptoms and general obstetric precautions including but not limited to vaginal bleeding, contractions, leaking of fluid and fetal movement were reviewed in detail with the patient.  All questions were answered.   Follow-up: Return in about 1 week (around 01/19/2021) for Return OB visit.  No orders of the defined types were placed in this encounter.  Raelyn Mora MSN, CNM 01/12/2021 4:41 PM

## 2021-01-13 ENCOUNTER — Encounter: Payer: Self-pay | Admitting: Student

## 2021-01-15 ENCOUNTER — Encounter: Payer: Self-pay | Admitting: Obstetrics and Gynecology

## 2021-01-21 ENCOUNTER — Other Ambulatory Visit: Payer: Self-pay

## 2021-01-21 ENCOUNTER — Ambulatory Visit (INDEPENDENT_AMBULATORY_CARE_PROVIDER_SITE_OTHER): Payer: Self-pay

## 2021-01-21 VITALS — BP 128/78 | HR 76 | Temp 97.2°F | Wt 203.0 lb

## 2021-01-21 DIAGNOSIS — Z3480 Encounter for supervision of other normal pregnancy, unspecified trimester: Secondary | ICD-10-CM

## 2021-01-21 DIAGNOSIS — Z3A39 39 weeks gestation of pregnancy: Secondary | ICD-10-CM

## 2021-01-21 DIAGNOSIS — Z8759 Personal history of other complications of pregnancy, childbirth and the puerperium: Secondary | ICD-10-CM

## 2021-01-21 NOTE — Patient Instructions (Signed)

## 2021-01-21 NOTE — Progress Notes (Signed)
   LOW-RISK PREGNANCY OFFICE VISIT  Patient name: Allison Ferrell MRN [id number]  Date of birth: 2001/06/21 Chief Complaint:   Routine Prenatal Visit  Subjective:   Allison Ferrell is a 20 y.o. G71P1001 female at [redacted]w[redacted]d with an Estimated Date of Delivery: 01/28/21 being seen today for ongoing management of a low-risk pregnancy aeb has Supervision of other normal pregnancy, antepartum on their problem list.  Patient presents today with complaint of backache. She states that she is having lower back pain and pelvic pain.  She reports she has not attempted any interventions for her pain.   Patient endorses fetal movement.  Patient denies vaginal concerns including abnormal discharge, leaking of fluid, and bleeding. Patient denies abdominal cramping or contractions.  Contractions: Not present. Vag. Bleeding: None.  Movement: Present.  Reviewed past medical,surgical, social, obstetrical and family history as well as problem list, medications and allergies.  Objective   Vitals:   01/21/21 0909  BP: 128/78  Pulse: 76  Temp: (!) 97.2 F (36.2 C)  Weight: 203 lb (92.1 kg)  Body mass index is 34.84 kg/m.  Total Weight Gain:19 lb (8.618 kg)         Physical Examination:   General appearance: Well appearing, and in no distress  Mental status: Alert, oriented to person, place, and time  Skin: Warm & dry  Cardiovascular: Normal heart rate noted  Respiratory: Normal respiratory effort, no distress  Abdomen: Soft, gravid, nontender, AGA with Fundal Height: 39 cm  Pelvic: Cervical exam performed  Dilation: Closed Effacement (%): 20 Station: Ballotable    Extremities: Edema: Trace  Fetal Status: Fetal Heart Rate (bpm): 141  Movement: Present   No results found for this or any previous visit (from the past 24 hour(s)).  Assessment & Plan:  Low-risk pregnancy of a 20 y.o., G2P1001 at [redacted]w[redacted]d with an Estimated Date of Delivery: 01/28/21   1. Supervision of other normal pregnancy,  antepartum -Anticipatory guidance for upcoming appts. -Patient to next appt in 1 weeks for an in-person visit. -Cervical exam performed and findings discussed. -Reassured of normalcy of no cervical change with report of no contractions.  -Discussed lower back pain and reviewed some interventions.   2. [redacted] weeks gestation of pregnancy -Doing well. -Discussed IOL at 40+ weeks. -Patient expresses desire to allow baby to "come on her own." -Reviewed plan to schedule IOL at 41 weeks with weekly appts and NST after 40 weeks. -Educated on what, why, and how we perform NST. -Patient agreeable with plan.   -[facility] request [facility] -Admission orders placed.   3. History of gestational hypertension -BP normotensive today. -Taking bASA as prescribed      Meds: No orders of the defined types were placed in this encounter.  Labs/procedures today:  Lab Orders  No laboratory test(s) ordered today     Reviewed: Term labor symptoms and general obstetric precautions including but not limited to vaginal bleeding, contractions, leaking of fluid and fetal movement were reviewed in detail with the patient.  All questions were answered.  Follow-up: No follow-ups on file.  No orders of the defined types were placed in this encounter.  Cherre Robins MSN, CNM 01/21/2021

## 2021-01-25 ENCOUNTER — Encounter (HOSPITAL_COMMUNITY): Payer: Self-pay | Admitting: Obstetrics & Gynecology

## 2021-01-25 ENCOUNTER — Inpatient Hospital Stay (HOSPITAL_COMMUNITY)
Admission: AD | Admit: 2021-01-25 | Discharge: 2021-01-28 | DRG: 807 | Disposition: A | Discharge status: Home / Self Care | Payer: Medicaid Other | Attending: Obstetrics and Gynecology | Admitting: Obstetrics and Gynecology

## 2021-01-25 DIAGNOSIS — O139 Gestational [pregnancy-induced] hypertension without significant proteinuria, unspecified trimester: Secondary | ICD-10-CM | POA: Diagnosis present

## 2021-01-25 DIAGNOSIS — O134 Gestational [pregnancy-induced] hypertension without significant proteinuria, complicating childbirth: Principal | ICD-10-CM | POA: Diagnosis present

## 2021-01-25 DIAGNOSIS — Z3480 Encounter for supervision of other normal pregnancy, unspecified trimester: Secondary | ICD-10-CM

## 2021-01-25 DIAGNOSIS — O99824 Streptococcus B carrier state complicating childbirth: Secondary | ICD-10-CM | POA: Diagnosis present

## 2021-01-25 DIAGNOSIS — Z3A39 39 weeks gestation of pregnancy: Secondary | ICD-10-CM

## 2021-01-25 DIAGNOSIS — R030 Elevated blood-pressure reading, without diagnosis of hypertension: Secondary | ICD-10-CM | POA: Diagnosis present

## 2021-01-25 LAB — CBC
HCT: 35.4 % — ABNORMAL LOW (ref 36.0–46.0)
Hemoglobin: 11.9 g/dL — ABNORMAL LOW (ref 12.0–15.0)
MCH: 30.4 pg (ref 26.0–34.0)
MCHC: 33.6 g/dL (ref 30.0–36.0)
MCV: 90.5 fL (ref 80.0–100.0)
Platelets: 227 10*3/uL (ref 150–400)
RBC: 3.91 MIL/uL (ref 3.87–5.11)
RDW: 16.8 % — ABNORMAL HIGH (ref 11.5–15.5)
WBC: 8.5 10*3/uL (ref 4.0–10.5)
nRBC: 0 % (ref 0.0–0.2)

## 2021-01-25 LAB — TYPE AND SCREEN
ABO/RH(D): O POS
Antibody Screen: NEGATIVE

## 2021-01-25 MED ORDER — LIDOCAINE HCL (PF) 1 % IJ SOLN
30.0000 mL | INTRAMUSCULAR | Status: DC | PRN
Start: 2021-01-25 — End: 2021-01-26

## 2021-01-25 MED ORDER — SOD CITRATE-CITRIC ACID 500-334 MG/5ML PO SOLN: 30.0000 mL | ORAL | Status: DC | PRN

## 2021-01-25 MED ORDER — FENTANYL CITRATE (PF) 100 MCG/2ML IJ SOLN
50.0000 ug | INTRAMUSCULAR | Status: DC | PRN
  Administered 2021-01-26 (×3): 100 ug via INTRAVENOUS
  Filled 2021-01-25 (×3): qty 2

## 2021-01-25 MED ORDER — PENICILLIN G POT IN DEXTROSE 60000 UNIT/ML IV SOLN
3.0000 10*6.[IU] | INTRAVENOUS | Status: DC
  Administered 2021-01-26: 3 10*6.[IU] via INTRAVENOUS
  Filled 2021-01-25 (×2): qty 50

## 2021-01-25 MED ORDER — OXYTOCIN-SODIUM CHLORIDE 30-0.9 UT/500ML-% IV SOLN: 2.5000 [IU]/h | INTRAVENOUS | Status: DC

## 2021-01-25 MED ORDER — LACTATED RINGERS IV SOLN: INTRAVENOUS | Status: DC

## 2021-01-25 MED ORDER — LACTATED RINGERS IV SOLN: 500.0000 mL | INTRAVENOUS | Status: DC | PRN

## 2021-01-25 MED ORDER — SODIUM CHLORIDE 0.9 % IV SOLN
5.0000 10*6.[IU] | Freq: Once | INTRAVENOUS | Status: AC
  Administered 2021-01-25: 5 10*6.[IU] via INTRAVENOUS
  Filled 2021-01-25: qty 5

## 2021-01-25 MED ORDER — ACETAMINOPHEN 325 MG PO TABS: 650.0000 mg | ORAL_TABLET | ORAL | Status: DC | PRN

## 2021-01-25 MED ORDER — OXYTOCIN BOLUS FROM INFUSION
333.0000 mL | Freq: Once | INTRAVENOUS | Status: AC
  Administered 2021-01-26: 333 mL via INTRAVENOUS

## 2021-01-25 MED ORDER — ONDANSETRON HCL 4 MG/2ML IJ SOLN: 4.0000 mg | Freq: Four times a day (QID) | INTRAMUSCULAR | Status: DC | PRN

## 2021-01-25 NOTE — H&P (Addendum)
OBSTETRIC ADMISSION HISTORY AND PHYSICAL  Allison Ferrell is a 20 y.o. female G2P1001 with IUP at [redacted]w[redacted]d by LMP presenting for IOL due to elevated BP. She reports +FMs, No LOF, no VB, no blurry vision, headaches or peripheral edema, and RUQ pain.  She plans on breast feeding. She request nexplanon OP for birth control (@HD , no insurance). She received her prenatal care at [facility]   Dating: By LMP --->  Estimated Date of Delivery: 01/28/21  Sono:  09/06/20 [redacted]w[redacted]d, normal anatomy, CWD, cephalic, anterior placental lie, 309g, 37.5%ile  Prenatal History/Complications:  Elevated BP in MAU (no diagnosis of HTN this pregnancy) History of gHTN-G1 GBS positive COVID in pregnancy (+11/02/20, hospitalized) BMI 35   Past Medical History:  Diagnosis Date  . Anemia   . Chlamydia   . Infection    UTI  . Preeclampsia 2019   Past Surgical History:  Procedure Laterality Date  . NO PAST SURGERIES     OB History    Gravida  2   Para  1   Term  1   Preterm      AB      Living  1     SAB      IAB      Ectopic      Multiple  0   Live Births  1          Social History   Socioeconomic History  . Marital status: Single    Spouse name: Not on file  . Number of children: Not on file  . Years of education: Not on file  . Highest education level: High school graduate  Occupational History  . Not on file  Tobacco Use  . Smoking status: Never Smoker  . Smokeless tobacco: Never Used  Vaping Use  . Vaping Use: Never used  Substance and Sexual Activity  . Alcohol use: Never  . Drug use: Never  . Sexual activity: Yes    Birth control/protection: None  Other Topics Concern  . Not on file  Social History Narrative  . Not on file   Social Determinants of Health   Financial Resource Strain: Not on file  Food Insecurity: Not on file  Transportation Needs: Not on file  Physical Activity: Not on file  Stress: Not on file  Social Connections: Not on file    Family History  Problem Relation Age of Onset  . Healthy Mother   . Healthy Father    No Known Allergies  Medications Prior to Admission  Medication Sig Dispense Refill Last Dose  . ascorbic acid (VITAMIN C) 500 MG tablet Take 1 tablet (500 mg total) by mouth every other day. Take with iron tablet 30 tablet 3 01/25/2021 at Unknown time  . aspirin (BAYER ASPIRIN EC LOW DOSE) 81 MG EC tablet Take 1 tablet (81 mg total) by mouth daily. Swallow whole. 30 tablet 2 01/25/2021 at Unknown time  . ferrous sulfate (FERROUSUL) 325 (65 FE) MG tablet Take 1 tablet (325 mg total) by mouth every other day. Take with Vitamin C tablet 30 tablet 3 01/25/2021 at Unknown time  . Prenat-Fe Poly-Methfol-FA-DHA (VITAFOL ULTRA) 29-0.6-0.4-200 MG CAPS Take 1 tablet by mouth daily. 33 capsule 0 01/25/2021 at Unknown time  . Elastic Bandages & Supports (COMFORT FIT MATERNITY SUPP SM) MISC 1 Units by Does not apply route daily as needed. 1 each 0     Review of Systems  All systems reviewed and negative except as stated in HPI  Blood pressure (!) 145/85, pulse 72, temperature 97.6 F (36.4 C), resp. rate 17, height 5\' 4"  (1.626 m), weight 92.5 kg, last menstrual period 04/23/2020, SpO2 97 %, unknown if currently breastfeeding. General appearance: alert and cooperative Lungs: normal respiratory effort Heart: regular rate and rhythm Abdomen: soft, non-tender; gravid Extremities: Homans sign is negative, no sign of DVT  Presentation: cephalic by RN exam in MAU Fetal monitoring:  Baseline: 135 bpm, Variability: Good {> 6 bpm), Accelerations: Reactive, and Decelerations: Absent Uterine activity: Frequency - Every 6-8 minutes Dilation: 1 Effacement (%): 70 Station: Ballotable Exam by:: weston,rn  Prenatal labs: ABO, Rh: --/--/O POS (03/22 2246) Antibody: NEG (03/22 2246) Rubella: 1.27 (10/13 1546) RPR: Non Reactive (01/13 0849)  HBsAg: Negative (10/13 1546)  HIV: Non Reactive (01/13 0849)  GBS: Positive/--  (03/02 1508)  2 hr Glucola normal Genetic screening normal Anatomy 09-13-1994 normal  Prenatal Transfer Tool  Maternal Diabetes: No Genetic Screening: Normal Maternal Ultrasounds/Referrals: Normal Fetal Ultrasounds or other Referrals:  None Maternal Substance Abuse:  No Significant Maternal Medications:  None Significant Maternal Lab Results: Group B Strep positive  Results for orders placed or performed during the hospital encounter of 01/25/21 (from the past 24 hour(s))  CBC   Collection Time: 01/25/21 10:46 PM  Result Value Ref Range   WBC 8.5 4.0 - 10.5 K/uL   RBC 3.91 3.87 - 5.11 MIL/uL   Hemoglobin 11.9 (L) 12.0 - 15.0 g/dL   HCT 01/27/21 (L) 25.4 - 98.2 %   MCV 90.5 80.0 - 100.0 fL   MCH 30.4 26.0 - 34.0 pg   MCHC 33.6 30.0 - 36.0 g/dL   RDW 64.1 (H) 58.3 - 09.4 %   Platelets 227 150 - 400 K/uL   nRBC 0.0 0.0 - 0.2 %  Type and screen   Collection Time: 01/25/21 10:46 PM  Result Value Ref Range   ABO/RH(D) O POS    Antibody Screen NEG    Sample Expiration      01/28/2021,2359 Performed at [facility], [address], [place], [place]     Patient Active Problem List   Diagnosis Date Noted  . Indication for care in labor or delivery 01/25/2021  . Supervision of other normal pregnancy, antepartum 07/26/2020    Assessment/Plan:  Allison Ferrell is a 20 y.o. G2P1001 at [redacted]w[redacted]d here for IOL due to elevated BP  #IOL: Discussed IOL process with patient. Given cervical exam FB placed without difficulty, cytotec 50 mcg buccal dosed. #Pain: PRN, Epidural upon request #FWB: Cat I #ID: GBS, PCN #MOF: Breast #MOC:Nexplanon OP (HD) #Circ: NA #Elevated BP: no prior diagnosis this pregnancy, preE labs on admission normal. Patient asymptomatic. Will continue to monitor.  [redacted]w[redacted]d, MD  01/25/2021, 11:55 PM   GME ATTESTATION:  I saw and evaluated the patient. I agree with the findings and the plan of care as documented in the resident's note.  01/27/2021, MD OB Fellow, Faculty [facility], Center for [facility] 01/26/2021 1:46 AM

## 2021-01-25 NOTE — MAU Note (Signed)
ctxs since this morning . About 4 min apart now. Denies any vag bleeding or leaking . Good fetal movement felt.

## 2021-01-26 ENCOUNTER — Encounter (HOSPITAL_COMMUNITY): Payer: Self-pay | Admitting: Family Medicine

## 2021-01-26 ENCOUNTER — Encounter: Payer: Self-pay | Admitting: Women's Health

## 2021-01-26 ENCOUNTER — Other Ambulatory Visit: Payer: Self-pay

## 2021-01-26 ENCOUNTER — Inpatient Hospital Stay (HOSPITAL_COMMUNITY): Payer: Medicaid Other | Admitting: Anesthesiology

## 2021-01-26 DIAGNOSIS — Z3A39 39 weeks gestation of pregnancy: Secondary | ICD-10-CM

## 2021-01-26 DIAGNOSIS — O134 Gestational [pregnancy-induced] hypertension without significant proteinuria, complicating childbirth: Secondary | ICD-10-CM

## 2021-01-26 DIAGNOSIS — O99824 Streptococcus B carrier state complicating childbirth: Secondary | ICD-10-CM

## 2021-01-26 LAB — CBC
HCT: 37.3 % (ref 36.0–46.0)
HCT: 37.3 % (ref 36.0–46.0)
Hemoglobin: 12.7 g/dL (ref 12.0–15.0)
Hemoglobin: 12.6 g/dL (ref 12.0–15.0)
MCH: 30.3 pg (ref 26.0–34.0)
MCH: 30.4 pg (ref 26.0–34.0)
MCHC: 34 g/dL (ref 30.0–36.0)
MCHC: 33.8 g/dL (ref 30.0–36.0)
MCV: 89 fL (ref 80.0–100.0)
MCV: 90.1 fL (ref 80.0–100.0)
Platelets: 243 10*3/uL (ref 150–400)
Platelets: 237 10*3/uL (ref 150–400)
RBC: 4.19 MIL/uL (ref 3.87–5.11)
RBC: 4.14 MIL/uL (ref 3.87–5.11)
RDW: 16.7 % — ABNORMAL HIGH (ref 11.5–15.5)
RDW: 16.5 % — ABNORMAL HIGH (ref 11.5–15.5)
WBC: 14.6 10*3/uL — ABNORMAL HIGH (ref 4.0–10.5)
WBC: 13.7 10*3/uL — ABNORMAL HIGH (ref 4.0–10.5)
nRBC: 0 % (ref 0.0–0.2)
nRBC: 0 % (ref 0.0–0.2)

## 2021-01-26 LAB — COMPREHENSIVE METABOLIC PANEL
ALT: 11 U/L (ref 0–44)
AST: 20 U/L (ref 15–41)
Albumin: 2.9 g/dL — ABNORMAL LOW (ref 3.5–5.0)
Alkaline Phosphatase: 86 U/L (ref 38–126)
Anion gap: 7 (ref 5–15)
BUN: 6 mg/dL (ref 6–20)
CO2: 20 mmol/L — ABNORMAL LOW (ref 22–32)
Calcium: 8.8 mg/dL — ABNORMAL LOW (ref 8.9–10.3)
Chloride: 104 mmol/L (ref 98–111)
Creatinine, Ser: 0.51 mg/dL (ref 0.44–1.00)
GFR, Estimated: >60 mL/min (ref >60)
Glucose, Bld: 83 mg/dL (ref 70–99)
Potassium: 3.4 mmol/L — ABNORMAL LOW (ref 3.5–5.1)
Sodium: 131 mmol/L — ABNORMAL LOW (ref 135–145)
Total Bilirubin: 0.6 mg/dL (ref 0.3–1.2)
Total Protein: 6.1 g/dL — ABNORMAL LOW (ref 6.5–8.1)

## 2021-01-26 LAB — PROTEIN / CREATININE RATIO, URINE
Creatinine, Urine: 67.97 mg/dL
Protein Creatinine Ratio: 0.16 mg/mg{Cre} — ABNORMAL HIGH (ref 0.00–0.15)
Total Protein, Urine: 11 mg/dL

## 2021-01-26 LAB — RPR: RPR Ser Ql: NONREACTIVE

## 2021-01-26 MED ORDER — PHENYLEPHRINE 40 MCG/ML (10ML) SYRINGE FOR IV PUSH (FOR BLOOD PRESSURE SUPPORT)
80.0000 ug | PREFILLED_SYRINGE | INTRAVENOUS | Status: DC | PRN
  Filled 2021-01-26: qty 10

## 2021-01-26 MED ORDER — DIPHENHYDRAMINE HCL 25 MG PO CAPS: 25.0000 mg | ORAL_CAPSULE | Freq: Four times a day (QID) | ORAL | Status: DC | PRN

## 2021-01-26 MED ORDER — TERBUTALINE SULFATE 1 MG/ML IJ SOLN: 0.2500 mg | Freq: Once | INTRAMUSCULAR | Status: DC | PRN

## 2021-01-26 MED ORDER — IBUPROFEN 600 MG PO TABS
600.0000 mg | ORAL_TABLET | Freq: Four times a day (QID) | ORAL | Status: DC
  Administered 2021-01-26 – 2021-01-28 (×8): 600 mg via ORAL
  Filled 2021-01-26 (×8): qty 1

## 2021-01-26 MED ORDER — MEDROXYPROGESTERONE ACETATE 150 MG/ML IM SUSP: 150.0000 mg | INTRAMUSCULAR | Status: DC | PRN

## 2021-01-26 MED ORDER — FERROUS SULFATE 325 (65 FE) MG PO TABS
325.0000 mg | ORAL_TABLET | ORAL | Status: DC
  Administered 2021-01-28: 325 mg via ORAL
  Filled 2021-01-26: qty 1

## 2021-01-26 MED ORDER — MISOPROSTOL 50MCG HALF TABLET
50.0000 ug | ORAL_TABLET | ORAL | Status: DC
  Administered 2021-01-26: 50 ug via BUCCAL

## 2021-01-26 MED ORDER — EPHEDRINE 5 MG/ML INJ: 10.0000 mg | INTRAVENOUS | Status: DC | PRN

## 2021-01-26 MED ORDER — PHENYLEPHRINE 40 MCG/ML (10ML) SYRINGE FOR IV PUSH (FOR BLOOD PRESSURE SUPPORT): 80.0000 ug | PREFILLED_SYRINGE | INTRAVENOUS | Status: DC | PRN

## 2021-01-26 MED ORDER — FENTANYL-BUPIVACAINE-NACL 0.5-0.125-0.9 MG/250ML-% EP SOLN
12.0000 mL/h | EPIDURAL | Status: DC | PRN
Start: 2021-01-26 — End: 2021-01-26
  Administered 2021-01-26: 12 mL/h via EPIDURAL
  Filled 2021-01-26 (×2): qty 250

## 2021-01-26 MED ORDER — OXYTOCIN-SODIUM CHLORIDE 30-0.9 UT/500ML-% IV SOLN
1.0000 m[IU]/min | INTRAVENOUS | Status: DC
  Administered 2021-01-26: 2 m[IU]/min via INTRAVENOUS
  Filled 2021-01-26: qty 500

## 2021-01-26 MED ORDER — LACTATED RINGERS IV SOLN
500.0000 mL | Freq: Once | INTRAVENOUS | Status: AC
  Administered 2021-01-26: 500 mL via INTRAVENOUS

## 2021-01-26 MED ORDER — ZOLPIDEM TARTRATE 5 MG PO TABS: 5.0000 mg | ORAL_TABLET | Freq: Every evening | ORAL | Status: DC | PRN

## 2021-01-26 MED ORDER — LIDOCAINE HCL (PF) 1 % IJ SOLN
INTRAMUSCULAR | Status: DC | PRN
  Administered 2021-01-26: 8 mL via EPIDURAL

## 2021-01-26 MED ORDER — SIMETHICONE 80 MG PO CHEW: 80.0000 mg | CHEWABLE_TABLET | ORAL | Status: DC | PRN

## 2021-01-26 MED ORDER — ACETAMINOPHEN 325 MG PO TABS: 650.0000 mg | ORAL_TABLET | ORAL | Status: DC | PRN

## 2021-01-26 MED ORDER — MISOPROSTOL 50MCG HALF TABLET: 50.0000 ug | ORAL_TABLET | ORAL | Status: DC | PRN

## 2021-01-26 MED ORDER — BENZOCAINE-MENTHOL 20-0.5 % EX AERO: 1.0000 "application " | INHALATION_SPRAY | CUTANEOUS | Status: DC | PRN

## 2021-01-26 MED ORDER — SENNOSIDES-DOCUSATE SODIUM 8.6-50 MG PO TABS
2.0000 | ORAL_TABLET | Freq: Every day | ORAL | Status: DC
  Administered 2021-01-27 – 2021-01-28 (×2): 2 via ORAL
  Filled 2021-01-26 (×2): qty 2

## 2021-01-26 MED ORDER — ONDANSETRON HCL 4 MG PO TABS: 4.0000 mg | ORAL_TABLET | ORAL | Status: DC | PRN

## 2021-01-26 MED ORDER — DIBUCAINE (PERIANAL) 1 % EX OINT: 1.0000 "application " | TOPICAL_OINTMENT | CUTANEOUS | Status: DC | PRN

## 2021-01-26 MED ORDER — PRENATAL MULTIVITAMIN CH
1.0000 | ORAL_TABLET | Freq: Every day | ORAL | Status: DC
  Administered 2021-01-27 – 2021-01-28 (×2): 1 via ORAL
  Filled 2021-01-26 (×2): qty 1

## 2021-01-26 MED ORDER — MISOPROSTOL 50MCG HALF TABLET
ORAL_TABLET | ORAL | Status: AC
  Filled 2021-01-26: qty 1

## 2021-01-26 MED ORDER — WITCH HAZEL-GLYCERIN EX PADS
1.0000 | MEDICATED_PAD | CUTANEOUS | Status: DC | PRN
Start: 2021-01-26 — End: 2021-01-28

## 2021-01-26 MED ORDER — ONDANSETRON HCL 4 MG/2ML IJ SOLN: 4.0000 mg | INTRAMUSCULAR | Status: DC | PRN

## 2021-01-26 MED ORDER — COCONUT OIL OIL
1.0000 "application " | TOPICAL_OIL | Status: DC | PRN
  Administered 2021-01-27: 1 via TOPICAL

## 2021-01-26 MED ORDER — DIPHENHYDRAMINE HCL 50 MG/ML IJ SOLN: 12.5000 mg | INTRAMUSCULAR | Status: DC | PRN

## 2021-01-26 MED ORDER — TETANUS-DIPHTH-ACELL PERTUSSIS 5-2.5-18.5 LF-MCG/0.5 IM SUSY: 0.5000 mL | PREFILLED_SYRINGE | Freq: Once | INTRAMUSCULAR | Status: DC

## 2021-01-26 NOTE — Progress Notes (Signed)
Loyce Flaming is a 20 y.o. G2P1001 at [redacted]w[redacted]d by LMP admitted for induction of labor due to elevated BP.  Strip Note  Objective: BP (!) 141/89   Pulse 90   Temp 97.6 F (36.4 C) (Oral)   Resp 18   Ht 5\' 4"  (1.626 m)   Wt 92.5 kg   LMP 04/23/2020   SpO2 97%   BMI 35.02 kg/m   FHT:  FHR: 140 bpm, variability: moderate,  accelerations:  Present,  decelerations:  Absent UC:   regular, every 2-3 minutes SVE:   Dilation: 6 Effacement (%): 100 Station: Plus 1 Exam by:: SRamsey,rn  Labs: Lab Results  Component Value Date   WBC 14.6 (H) 01/26/2021   HGB 12.7 01/26/2021   HCT 37.3 01/26/2021   MCV 89.0 01/26/2021   PLT 243 01/26/2021    Assessment / Plan: Induction of labor due to elevated BP,  progressing well on pitocin  Labor: Progressing on Pitocin, will continue to increase then AROM Preeclampsia:  labs stable Fetal Wellbeing:  Category I Pain Control:  Epidural upon request I/D:  GBS pos - given penicillin Anticipated MOD:  NSVD  01/28/2021 Lanita Stammen 01/26/2021, 7:55 AM

## 2021-01-26 NOTE — Discharge Summary (Addendum)
Postpartum Discharge Summary     Patient Name: Allison Ferrell DOB: [birth year] MRN: [id number]  Date of admission: 01/25/2021 Delivery date:01/26/2021  Delivering provider: Janet Berlin  Date of discharge: 01/27/2021  Admitting diagnosis: Indication for care in labor or delivery [O75.9] Intrauterine pregnancy: [redacted]w[redacted]d    Secondary diagnosis:  Active Problems:   Supervision of other normal pregnancy, antepartum   Indication for care in labor or delivery   Gestational HTN  Additional problems: None    Discharge diagnosis: Term Pregnancy Delivered and Gestational Hypertension                                              Post partum procedures:none Augmentation: AROM, Pitocin, Cytotec and IP Foley Complications: None  Hospital course: Induction of Labor With Vaginal Delivery   20y.o. yo G2P1001 at 364w5das admitted to the hospital 01/25/2021 for induction of labor.  Indication for induction: Gestational hypertension.  Patient had an uncomplicated labor course as follows: Membrane Rupture Time/Date: 11:10 AM ,01/26/2021   Delivery Method:Vaginal, Spontaneous  Episiotomy: None  Lacerations:  None  Details of delivery can be found in separate delivery note.  Patient had a routine postpartum course. She is ambulating, tolerating a regular diet, and voiding without difficulty. Blood pressures have been wnl. Patient is discharged home 01/27/21.  Newborn Data: Birth date:01/26/2021  Birth time:11:35 AM  Gender:Female  Living status:Living  Apgars:9 ,9  Weight:3390 g   Magnesium Sulfate received: No BMZ received: No Rhophylac:N/A MMR:N/A T-DaP:Given prenatally Flu: Yes Transfusion:No  Physical exam  Vitals:   01/26/21 1444 01/26/21 1834 01/26/21 2254 01/27/21 0558  BP: 113/74 121/68 134/81 117/71  Pulse:  71 81 73  Resp:   18 16  Temp:  98.7 F (37.1 C) 98.4 F (36.9 C) 98.1 F (36.7 C)  TempSrc:  Oral Oral Oral  SpO2:  97% 99% 100%  Weight:      Height:        General: alert, cooperative and no distress Lochia: appropriate Uterine Fundus: firm Incision: N/A DVT Evaluation: No evidence of DVT seen on physical exam. Labs: Lab Results  Component Value Date   WBC 13.7 (H) 01/26/2021   HGB 12.6 01/26/2021   HCT 37.3 01/26/2021   MCV 90.1 01/26/2021   PLT 237 01/26/2021   CMP Latest Ref Rng & Units 01/25/2021  Glucose 70 - 99 mg/dL 83  BUN 6 - 20 mg/dL 6  Creatinine 0.44 - 1.00 mg/dL 0.51  Sodium 135 - 145 mmol/L 131(L)  Potassium 3.5 - 5.1 mmol/L 3.4(L)  Chloride 98 - 111 mmol/L 104  CO2 22 - 32 mmol/L 20(L)  Calcium 8.9 - 10.3 mg/dL 8.8(L)  Total Protein 6.5 - 8.1 g/dL 6.1(L)  Total Bilirubin 0.3 - 1.2 mg/dL 0.6  Alkaline Phos 38 - 126 U/L 86  AST 15 - 41 U/L 20  ALT 0 - 44 U/L 11   Edinburgh Score: Edinburgh Postnatal Depression Scale Screening Tool 01/27/2021  I have been able to laugh and see the funny side of things. 0  I have looked forward with enjoyment to things. 0  I have blamed myself unnecessarily when things went wrong. 2  I have been anxious or worried for no good reason. 2  I have felt scared or panicky for no good reason. 0  Things have been getting on top of  me. 1  I have been so unhappy that I have had difficulty sleeping. 0  I have felt sad or miserable. 1  I have been so unhappy that I have been crying. 0  The thought of harming myself has occurred to me. 0  Edinburgh Postnatal Depression Scale Total 6     After visit meds:  Allergies as of 01/27/2021   No Known Allergies     Medication List    STOP taking these medications   aspirin 81 MG EC tablet Commonly known as: Bayer Aspirin EC Low Dose     TAKE these medications   acetaminophen 325 MG tablet Commonly known as: Tylenol Take 2 tablets (650 mg total) by mouth every 4 (four) hours as needed (for pain scale < 4).   ascorbic acid 500 MG tablet Commonly known as: VITAMIN C Take 1 tablet (500 mg total) by mouth every other day. Take with iron  tablet   coconut oil Oil Apply 1 application topically as needed.   Comfort Fit Maternity Supp Sm Misc 1 Units by Does not apply route daily as needed.   ferrous sulfate 325 (65 FE) MG tablet Commonly known as: FerrouSul Take 1 tablet (325 mg total) by mouth every other day. Take with Vitamin C tablet   ibuprofen 600 MG tablet Commonly known as: ADVIL Take 1 tablet (600 mg total) by mouth every 6 (six) hours.   Vitafol Ultra 29-0.6-0.4-200 MG Caps Take 1 tablet by mouth daily.        Discharge home in stable condition Infant Feeding: Bottle and Breast Infant Disposition:home with mother Discharge instruction: per After Visit Summary and Postpartum booklet. Activity: Advance as tolerated. Pelvic rest for 6 weeks.  Diet: routine diet Future Appointments: Future Appointments  Date Time Provider [place]  02/02/2021 10:15 AM [facility]ENING [facility] None   Follow up Visit: Message sent to Ren 01/27/21 by Sylvester Harder.  Appt in 4-6 weeks for postpartum visit, vaginal delivery. BP check in 1 week, pregnancy complicated by gHTN. [facility]planon placement at [facility], no insurance.   01/27/2021 Alcus Dad, MD   GME ATTESTATION:  I saw and evaluated the patient. I agree with the findings and the plan of care as documented in the residents note.  Arrie Senate, MD OB Fellow, [place] for [place] 01/27/2021 7:57 AM

## 2021-01-26 NOTE — Lactation Note (Signed)
This note was copied from a baby's chart. Lactation Consultation Note  Patient Name: Allison Ferrell Date: 01/26/2021 Reason for consult: Term Age:22 hours  Initial visit to 7 hours old infant of a P2 mother. Infant is cueing in basinet upon arrival. [facility] offered assistance with latch. Mother prefers cradle position to right breast. Infant latched easily. Noted immediate audible swallowing. Used support pillows. Demonstrated alignment. Infant is suckling rhythmically with flanged lips, and breast tissue moving. Mother denies pain or discomfort. Infant fed for ~22 minutes at breast. LC changed stool. Mother explains she started pumping 2-3 times a day since she was [redacted]w[redacted]d. Mother has colostrum stored at home. Talked to mother about hand expression, demonstrated technique and collected ~38mL in a spoon. Observed mature milk. Demonstrated spoon-feeding. Burped infant and placed her in basinet.  Mother request a hand pump because she would like to continue pumping. Educated mother about oversupply and its potential issues  for infant such as fast flow, reflux, frothy stools, etc and for mother like blocked ducts and even mastitis.    Plan: 1-Skin to skin 2-Aim for a deep, comfortable latch 3-Breastfeeding on demand or 8-12 times in 24h period. 4-Keep infant awake during breastfeeding session: massaging breast, infant's hand/shoulder/feet 5-Monitor voids and stools as signs good intake.  6-Encouraged maternal rest, hydration and food intake.  7-Contact LC as needed for feeds/support/concerns/questions   All questions answered at this time. Provided Lactation services brochure and promoted [employer]Joy booklet information.    Maternal Data Has patient been taught Hand Expression?: No Does the patient have breastfeeding experience prior to this delivery?: Yes How long did the patient breastfeed?: 19 months her 62 year old child  Feeding Mother's Current Feeding Choice: Breast  Milk  LATCH Score Latch: Grasps breast easily, tongue down, lips flanged, rhythmical sucking.  Audible Swallowing: Spontaneous and intermittent  Type of Nipple: Everted at rest and after stimulation  Comfort (Breast/Nipple): Soft / non-tender  Hold (Positioning): Assistance needed to correctly position infant at breast and maintain latch.  LATCH Score: 9  Interventions Interventions: Breast feeding basics reviewed;Assisted with latch;Skin to skin;Breast massage;Hand express;Adjust position;Hand pump;Expressed milk;Position options;Support pillows;Education  Discharge WIC Program: Yes  Consult Status Consult Status: Follow-up Date: 01/27/21 Follow-up type: In-patient    Allison Ferrell 01/26/2021, 6:56 PM

## 2021-01-26 NOTE — Anesthesia Preprocedure Evaluation (Signed)
Anesthesia Evaluation  Patient identified by MRN, date of birth, ID band Patient awake    Reviewed: Allergy & Precautions, NPO status , Patient's Chart, lab work & pertinent test results  Airway Mallampati: II  TM Distance: >3 FB Neck ROM: Full    Dental no notable dental hx.    Pulmonary neg pulmonary ROS,    Pulmonary exam normal breath sounds clear to auscultation       Cardiovascular Exercise Tolerance: Good hypertension, Normal cardiovascular exam Rhythm:Regular Rate:Normal     Neuro/Psych negative neurological ROS  negative psych ROS   GI/Hepatic negative GI ROS, Neg liver ROS,   Endo/Other  negative endocrine ROS  Renal/GU negative Renal ROS  negative genitourinary   Musculoskeletal negative musculoskeletal ROS (+)   Abdominal   Peds negative pediatric ROS (+)  Hematology negative hematology ROS (+) anemia ,   Anesthesia Other Findings   Reproductive/Obstetrics (+) Pregnancy                             Anesthesia Physical Anesthesia Plan  ASA: II  Anesthesia Plan: Epidural   Post-op Pain Management:    Induction:   PONV Risk Score and Plan: 2 and Treatment may vary due to age or medical condition  Airway Management Planned: Natural Airway  Additional Equipment: None  Intra-op Plan:   Post-operative Plan:   Informed Consent: I have reviewed the patients History and Physical, chart, labs and discussed the procedure including the risks, benefits and alternatives for the proposed anesthesia with the patient or authorized representative who has indicated his/her understanding and acceptance.       Plan Discussed with: Anesthesiologist  Anesthesia Plan Comments:         Anesthesia Quick Evaluation

## 2021-01-26 NOTE — Lactation Note (Signed)
This note was copied from a baby's chart. Lactation Consultation Note  Patient Name: Allison Ferrell Date: 01/26/2021 Reason for consult: L&D Initial assessment;Term Age:22 hours  Attempted LC visit after delivery. Mother states she feels cold and shaky, unsafe to hold newborn at the moment.  LC will come back to room at another time as possible.    Feeding Mother's Current Feeding Choice: Breast Milk and Formula  Interventions Interventions: Skin to skin;Education  Consult Status Date: 01/26/21 Follow-up type: In-patient    Jabril Pursell A Higuera Ancidey 01/26/2021, 12:27 PM

## 2021-01-26 NOTE — Anesthesia Procedure Notes (Signed)
Epidural Patient location during procedure: OB Start time: 01/26/2021 7:45 AM End time: 01/26/2021 7:55 AM  Staffing Anesthesiologist: Mellody Dance, MD Performed: anesthesiologist   Preanesthetic Checklist Completed: patient identified, IV checked, site marked, risks and benefits discussed, monitors and equipment checked, pre-op evaluation and timeout performed  Epidural Patient position: sitting Prep: DuraPrep Patient monitoring: heart rate, cardiac monitor, continuous pulse ox and blood pressure Approach: midline Location: L3-L4 Injection technique: LOR saline  Needle:  Needle type: Tuohy  Needle gauge: 17 G Needle length: 9 cm Needle insertion depth: 7 cm Catheter type: closed end flexible Catheter size: 20 Guage Catheter at skin depth: 12 cm Test dose: negative and Other  Assessment Events: blood not aspirated, injection not painful, no injection resistance and negative IV test  Additional Notes Informed consent obtained prior to proceeding including risk of failure, 1% risk of PDPH, risk of minor discomfort and bruising.  Discussed rare but serious complications including epidural abscess, permanent nerve injury, epidural hematoma.  Discussed alternatives to epidural analgesia and patient desires to proceed.  Timeout performed pre-procedure verifying patient name, procedure, and platelet count.  Patient tolerated procedure well.

## 2021-01-26 NOTE — Anesthesia Postprocedure Evaluation (Signed)
Anesthesia Post Note  Patient: Allison Ferrell  Procedure(s) Performed: AN AD HOC LABOR EPIDURAL     Patient location during evaluation: Mother Baby Anesthesia Type: Epidural Level of consciousness: awake Pain management: satisfactory to patient Vital Signs Assessment: post-procedure vital signs reviewed and stable Respiratory status: spontaneous breathing Cardiovascular status: stable Anesthetic complications: no   No complications documented.  Last Vitals:  Vitals:   01/26/21 1440 01/26/21 1444  BP: (!) 97/52 113/74  Pulse: 96   Resp: 16   Temp: 37.7 C   SpO2: 98%     Last Pain:  Vitals:   01/26/21 1735  TempSrc:   PainSc: 0-No pain   Pain Goal:                   [employer]

## 2021-01-27 DIAGNOSIS — O139 Gestational [pregnancy-induced] hypertension without significant proteinuria, unspecified trimester: Secondary | ICD-10-CM | POA: Diagnosis present

## 2021-01-27 MED ORDER — IBUPROFEN 600 MG PO TABS: 600.0000 mg | ORAL_TABLET | Freq: Four times a day (QID) | ORAL | Status: AC

## 2021-01-27 MED ORDER — COCONUT OIL OIL: 1.0000 "application " | TOPICAL_OIL | 0 refills | Status: DC | PRN

## 2021-01-27 MED ORDER — ACETAMINOPHEN 325 MG PO TABS: 650.0000 mg | ORAL_TABLET | ORAL | Status: DC | PRN

## 2021-01-27 NOTE — Lactation Note (Signed)
This note was copied from a baby's chart. Lactation Consultation Note  Patient Name: Allison Ferrell WCHEN'I Date: 01/27/2021 Reason for consult: Follow-up assessment;Term;Infant weight loss Age:22 hours  Visited with mo of 22 hours old FT female, she's a P2 and has been pumping since the pregnancy at [redacted]w[redacted]d. Baby's skin bilirubin is on high risk zone; but mom has already started pumping and supplementing baby after feedings but not consistently at every feeding.  Educated mom on breastmilk storage guidelines she had some breastmilk left over in one of the bottles from yesterday, she wasn't aware she had a fridge in her room. LC also discussed supplementation guidelines when feeding baby at the breast, newborn jaundice, size of baby's stomach, benefits of STS care and pumping schedule.  When LC came back to the room after bringing handout, mom was already nursing baby, but latch was shallow, she was doing typical cradle hold. Assisted mom with positioning, unswadle baby, but she was wearing clothes.  After repositioning she was able to get baby a bit deeper, mom has very large nipples and baby is unable to fit the entire nipple/areola complex into her mouth, discussed with mom option on how to nurse baby so it's comfortable for both of them.  Feeding plan:  1. Encouraged mom to feed baby STS 8-12 times/24 hours or sooner if feeding cues are present 2. She'll start pumping every 2-3 hours  3. She'll supplement baby with her EBM after every feeding at the breast, starting with 7-12 ml but will let her drink more if baby desires  No support person in mom's room at the time of [facility] consultation. Mom is bilingual and this consultation was done in Spanish due to parental request. Mom reported all questions and concerns were answered, she's aware of LC OP services and will call PRN.   Maternal Data    Feeding Mother's Current Feeding Choice: Breast Milk  LATCH Score Latch: Grasps  breast easily, tongue down, lips flanged, rhythmical sucking.  Audible Swallowing: A few with stimulation  Type of Nipple: Everted at rest and after stimulation  Comfort (Breast/Nipple): Soft / non-tender  Hold (Positioning): Assistance needed to correctly position infant at breast and maintain latch.  LATCH Score: 8   Lactation Tools Discussed/Used Tools: Pump Breast pump type: Manual Pump Education: Milk Storage;Setup, frequency, and cleaning Reason for Pumping: mother's request Pumping frequency: q 3 hours Pumped volume: 15 mL  Interventions Interventions: Breast feeding basics reviewed;Assisted with latch;Breast massage;Breast compression;Adjust position;Support pillows;Hand pump  Discharge    Consult Status Consult Status: Follow-up Date:  (prior discharge, either 03/24 or 03/25) Follow-up type: In-patient    Milagros Venetia Constable 01/27/2021, 12:39 PM

## 2021-01-28 NOTE — Lactation Note (Addendum)
This note was copied from a baby's chart. Lactation Consultation Note  Patient Name: Allison Ferrell Date: 01/28/2021 Reason for consult: Follow-up assessment;Infant weight loss;Term Age:22 hours  Previous LC reported this mom does not want to use an interpreter, speaks [place] well.  Mom resting with baby by her side. LC reviewed doc flow sheets with mom  And updated. Per mom the baby recently fed at the breast. Per mom denies sore nipples.  Per mom baby latches well at the breast and takes a bottle well.  LC recommended giving the baby consistent opportunities to latch at the  Breast to enhance the milk coming in quicker and prevention of engorgement.  LC provided the [facility] brochure with resources.    Maternal Data Has patient been taught Hand Expression?: Yes  Feeding Mother's Current Feeding Choice: Breast Milk  LATCH Score                    Lactation Tools Discussed/Used Pump Education: Milk Storage  Interventions    Discharge Discharge Education: Engorgement and breast care;Warning signs for feeding baby Pump: Manual  Consult Status Consult Status: Complete (mother declined follow up) Date: 01/28/21    Allison Ferrell 01/28/2021, 10:49 AM

## 2021-01-28 NOTE — Discharge Summary (Addendum)
Postpartum Discharge Summary     Patient Name: Allison Ferrell DOB: [birth year] MRN: [id number]  Date of admission: 01/25/2021 Delivery date:01/26/2021  Delivering provider: Janet Berlin  Date of discharge: 01/28/2021  Admitting diagnosis: Indication for care in labor or delivery [O75.9] Intrauterine pregnancy: [redacted]w[redacted]d    Secondary diagnosis:  Active Problems:   Supervision of other normal pregnancy, antepartum   Indication for care in labor or delivery   Gestational HTN   Vaginal delivery  Additional problems: None    Discharge diagnosis: Term Pregnancy Delivered and Gestational Hypertension                                              Post partum procedures: none Augmentation: AROM, Pitocin, Cytotec and IP Foley Complications: None  Hospital course: Induction of Labor With Vaginal Delivery   20y.o. G2P1001 at 333w5das admitted to the hospital 01/25/2021 for induction of labor.  Indication for induction: Gestational hypertension.  Patient had an uncomplicated labor course as follows: Membrane Rupture Time/Date: 11:10 AM ,01/26/2021   Delivery Method:Vaginal, Spontaneous  Episiotomy: None  Lacerations:  None  Details of delivery can be found in separate delivery note.  Patient had a routine postpartum course. She is ambulating, tolerating a regular diet, and voiding without difficulty. Blood pressures have been wnl. Patient is discharged home 01/28/21.  Newborn Data: Birth date:01/26/2021  Birth time:11:35 AM  Gender:Female  Living status:Living  Apgars:9 ,9  Weight:3390 g   Magnesium Sulfate received: No BMZ received: No Rhophylac:N/A MMR:N/A T-DaP:Given prenatally Flu: Yes Transfusion:No  Physical exam  Vitals:   01/26/21 2254 01/27/21 0558 01/27/21 1402 01/27/21 2031  BP: 134/81 117/71 121/89 118/63  Pulse: 81 73 87 83  Resp: 18 16 17 15   Temp: 98.4 F (36.9 C) 98.1 F (36.7 C) 98 F (36.7 C) 98.9 F (37.2 C)  TempSrc: Oral Oral Oral Oral  SpO2:  99% 100%  100%  Weight:      Height:       General: alert, cooperative and no distress Lochia: appropriate Uterine Fundus: firm Incision: N/A DVT Evaluation: No evidence of DVT seen on physical exam. Labs: Lab Results  Component Value Date   WBC 13.7 (H) 01/26/2021   HGB 12.6 01/26/2021   HCT 37.3 01/26/2021   MCV 90.1 01/26/2021   PLT 237 01/26/2021   CMP Latest Ref Rng & Units 01/25/2021  Glucose 70 - 99 mg/dL 83  BUN 6 - 20 mg/dL 6  Creatinine 0.44 - 1.00 mg/dL 0.51  Sodium 135 - 145 mmol/L 131(L)  Potassium 3.5 - 5.1 mmol/L 3.4(L)  Chloride 98 - 111 mmol/L 104  CO2 22 - 32 mmol/L 20(L)  Calcium 8.9 - 10.3 mg/dL 8.8(L)  Total Protein 6.5 - 8.1 g/dL 6.1(L)  Total Bilirubin 0.3 - 1.2 mg/dL 0.6  Alkaline Phos 38 - 126 U/L 86  AST 15 - 41 U/L 20  ALT 0 - 44 U/L 11   Edinburgh Score: Edinburgh Postnatal Depression Scale Screening Tool 01/27/2021  I have been able to laugh and see the funny side of things. 0  I have looked forward with enjoyment to things. 0  I have blamed myself unnecessarily when things went wrong. 2  I have been anxious or worried for no good reason. 2  I have felt scared or panicky for no good reason. 0  Things have been getting on top of me. 1  I have been so unhappy that I have had difficulty sleeping. 0  I have felt sad or miserable. 1  I have been so unhappy that I have been crying. 0  The thought of harming myself has occurred to me. 0  Edinburgh Postnatal Depression Scale Total 6     After visit meds:  Allergies as of 01/28/2021   No Known Allergies      Medication List     STOP taking these medications    aspirin 81 MG EC tablet Commonly known as: Bayer Aspirin EC Low Dose       TAKE these medications    acetaminophen 325 MG tablet Commonly known as: Tylenol Take 2 tablets (650 mg total) by mouth every 4 (four) hours as needed (for pain scale < 4).   ascorbic acid 500 MG tablet Commonly known as: VITAMIN C Take 1 tablet  (500 mg total) by mouth every other day. Take with iron tablet   coconut oil Oil Apply 1 application topically as needed.   Comfort Fit Maternity Supp Sm Misc 1 Units by Does not apply route daily as needed.   ferrous sulfate 325 (65 FE) MG tablet Commonly known as: FerrouSul Take 1 tablet (325 mg total) by mouth every other day. Take with Vitamin C tablet   ibuprofen 600 MG tablet Commonly known as: ADVIL Take 1 tablet (600 mg total) by mouth every 6 (six) hours.   Vitafol Ultra 29-0.6-0.4-200 MG Caps Take 1 tablet by mouth daily.         Discharge home in stable condition Infant Feeding: Bottle and Breast Infant Disposition:home with mother Discharge instruction: per After Visit Summary and Postpartum booklet. Activity: Advance as tolerated. Pelvic rest for 6 weeks.  Diet: routine diet Future Appointments: Future Appointments  Date Time Provider [place]  02/02/2021 10:15 AM [facility] None  02/03/2021  1:00 PM [place] None  03/10/2021  3:30 PM Laury Deep, CNM [facility] None   Follow up Visit:  Message sent to Ren 01/27/21 by Sylvester Harder.  Appt in 4-6 weeks for postpartum visit, vaginal delivery. BP check in 1 week, pregnancy complicated by gHTN. Nexplanon placement at [facility], no insurance.   07/04/5620 Arrie Senate, MD   GME ATTESTATION:  I saw and evaluated the patient. I agree with the findings and the plan of care as documented in the resident's note.  Arrie Senate, MD OB Fellow, [place] for [place] 01/28/2021 7:45 AM  Attestation of Attending Supervision of Advanced Practice Provider (PA/CNM/NP): Evaluation and management procedures were performed by the Advanced Practice Provider under my supervision and collaboration.  I have reviewed the Advanced Practice Provider's note and chart, and I agree with the management and plan. I have also made any necessary editorial  changes.   Annalee Genta, DO Attending [place], [facility] for [facility], [facility] 01/31/2021 1:22 PM

## 2021-02-02 ENCOUNTER — Encounter: Payer: Self-pay | Admitting: Certified Nurse Midwife

## 2021-02-02 ENCOUNTER — Other Ambulatory Visit (HOSPITAL_COMMUNITY): Payer: Self-pay | Attending: Family Medicine

## 2021-02-03 ENCOUNTER — Ambulatory Visit: Payer: Self-pay

## 2021-02-04 ENCOUNTER — Inpatient Hospital Stay (HOSPITAL_COMMUNITY): Payer: Self-pay

## 2021-02-04 ENCOUNTER — Inpatient Hospital Stay (HOSPITAL_COMMUNITY): Admission: AD | Admit: 2021-02-04 | Payer: Self-pay | Source: Home / Self Care | Admitting: Family Medicine

## 2021-03-10 ENCOUNTER — Other Ambulatory Visit: Payer: Self-pay

## 2021-03-10 ENCOUNTER — Encounter: Payer: Self-pay | Admitting: Obstetrics and Gynecology

## 2021-03-10 ENCOUNTER — Telehealth (INDEPENDENT_AMBULATORY_CARE_PROVIDER_SITE_OTHER): Payer: Self-pay | Admitting: Obstetrics and Gynecology

## 2021-03-10 NOTE — Progress Notes (Signed)
Post Partum Visit Note  Allison Ferrell is a 20 y.o. G65P2002 female who presents for a postpartum visit. She is 6 weeks postpartum following a normal spontaneous vaginal delivery.  I have fully reviewed the prenatal and intrapartum course. The delivery was at 39.4 gestational weeks.  Anesthesia: epidural. Postpartum course has been uncomplicated. Baby is doing well. Baby is feeding by both breast and bottle - Enfamil. Bleeding staining only. Bowel function is normal. Bladder function is normal. Patient is not sexually active. Contraception method is none. Postpartum depression screening: negative: score 2   The pregnancy intention screening data noted above was reviewed. Potential methods of contraception were discussed. The patient elected to proceed with No Method - Other Reason- unsure of method allowed through the [employer].     Health Maintenance Due  Topic Date Due  . COVID-19 Vaccine (1) Never done  . HPV VACCINES (1 - 2-dose series) Never done  . TETANUS/TDAP  Never done  . PAP-Cervical Cytology Screening  Never done  . PAP SMEAR-Modifier  Never done    The following portions of the patient's history were reviewed and updated as appropriate: allergies, current medications, past family history, past medical history, past social history, past surgical history and problem list.  Review of Systems Constitutional: negative Eyes: negative Ears, nose, mouth, throat, and face: negative Respiratory: negative Cardiovascular: negative Gastrointestinal: negative Genitourinary:negative Integument/breast: negative Hematologic/lymphatic: negative Musculoskeletal:negative Neurological: negative Behavioral/Psych: negative Endocrine: negative Allergic/Immunologic: negative  Objective:  BP 116/67 (BP Location: Left Arm, Patient Position: Sitting, Cuff Size: Normal)   Pulse 64   Wt 185 lb 9.6 oz (84.2 kg)   Breastfeeding Yes   BMI 31.86 kg/m    General:  alert,  cooperative and no distress   Breasts:  normal  Lungs: not evaluated  Heart:  not evaluated  Abdomen: not evaluated   Wound N/A  GU exam:  N/A       Assessment:   Encounter for postpartum care of lactating mother - Normal postpartum exam.   Plan:   Essential components of care per ACOG recommendations:  1.  Mood and well being: Patient with negative depression screening today. Reviewed local resources for support.  - Patient tobacco use? No.   - hx of drug use? No.    2. Infant care and feeding:  -Patient currently breastmilk feeding? Yes. Discussed returning to work and pumping. Reviewed importance of draining breast regularly to support lactation.  -Social determinants of health (SDOH) reviewed in EPIC. No concerns  3. Sexuality, contraception and birth spacing - Patient does not want a pregnancy in the next year.  Desired family size is 2 children.  - Reviewed forms of contraception in tiered fashion. Patient desired condoms today. Advised can come to office to get as many free condoms as she desires.  - Discussed birth spacing of 18 months  4. Sleep and fatigue -Encouraged family/partner/community support of 4 hrs of uninterrupted sleep to help with mood and fatigue  5. Physical Recovery  - Discussed patients delivery and complications. She describes her labor as good. - Patient had a Vaginal, no problems at delivery. Patient had no laceration. Perineal healing reviewed. Patient expressed understanding - Patient has urinary incontinence? No. - Patient is safe to resume physical and sexual activity  6.  Health Maintenance - HM due items addressed No - never had - Last pap smear No results found for: DIAGPAP Pap smear not done at today's visit.  -Breast Cancer screening indicated? No.   7.  Chronic Disease/Pregnancy Condition follow up: None  - PCP follow up  Raelyn Mora, CNM [facility] [employer], [facility]

## 2021-04-13 IMAGING — US US OB COMP LESS 14 WK
1 series · 15 of 25 positions shown · non-contrast
Comparison: None.

CLINICAL DATA: 18-year-old female with some pain and vaginal
bleeding. Estimated gestational age by LMP 7 weeks 0 days.

EXAM:
OBSTETRIC <14 WK ULTRASOUND
TECHNIQUE: Transabdominal ultrasound was performed for evaluation of the
gestation as well as the maternal uterus and adnexal regions.

[Series 1: us ob comp less 14 wk · 15 of 25 slices shown]
[im 1/25]
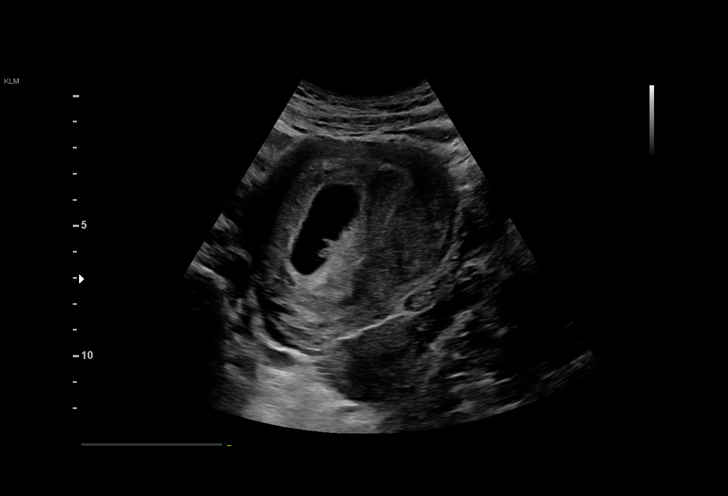
[im 3/25]
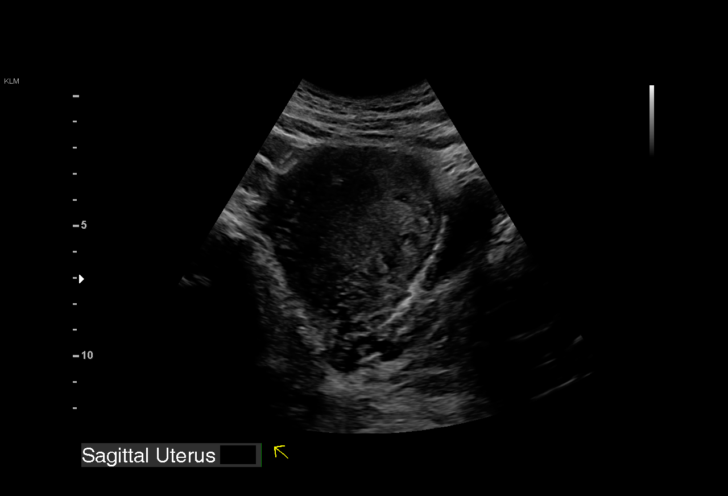
[im 5/25]
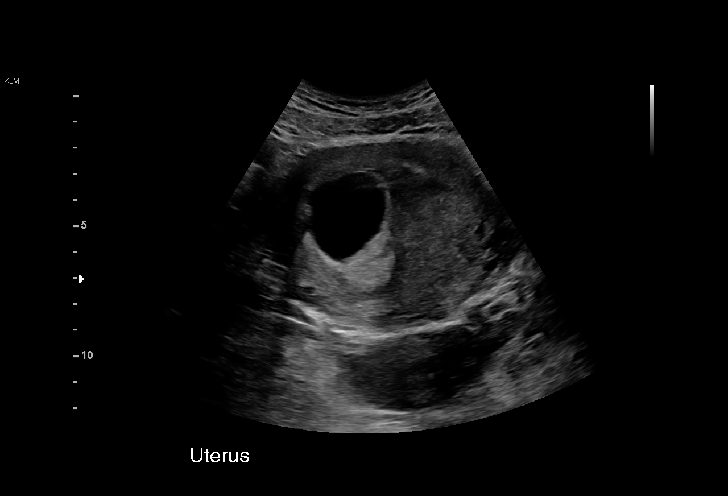
[im 6/25]
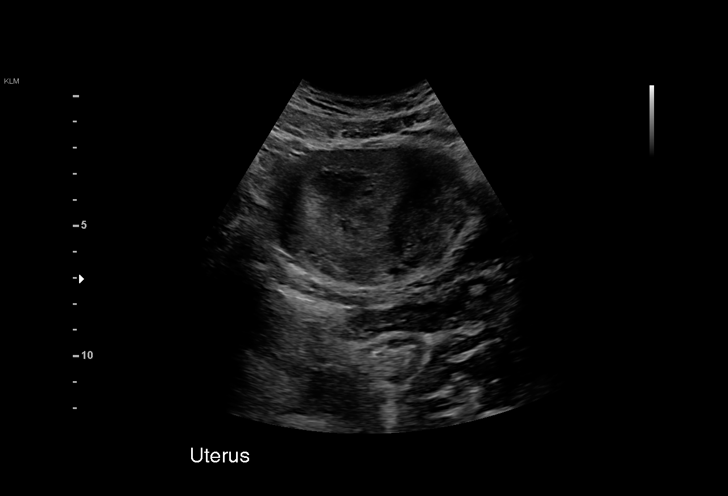
[im 8/25]
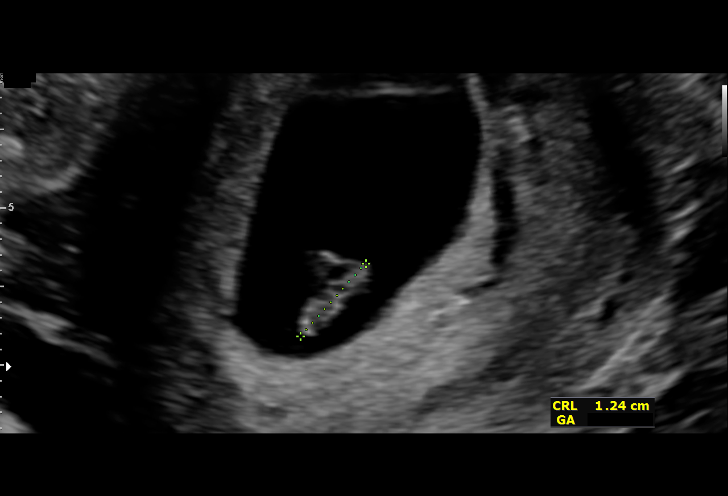
[im 10/25]
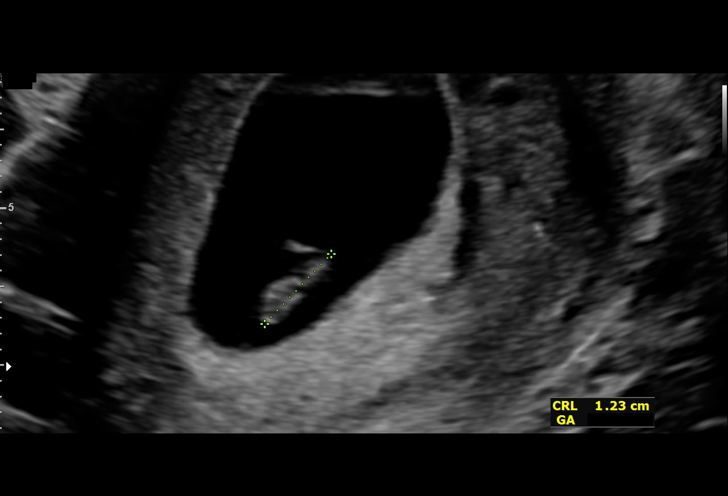
[im 11/25]
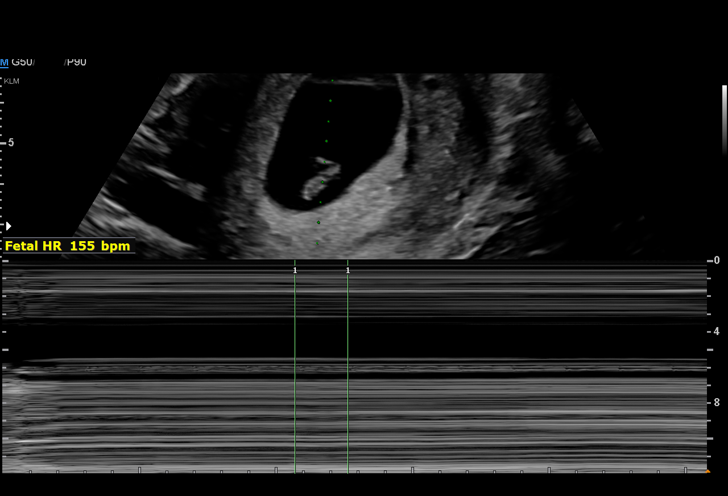
[im 13/25]
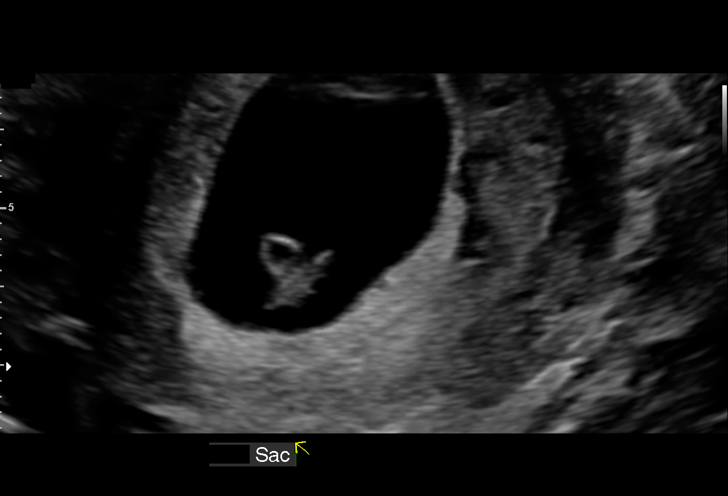
[im 15/25]
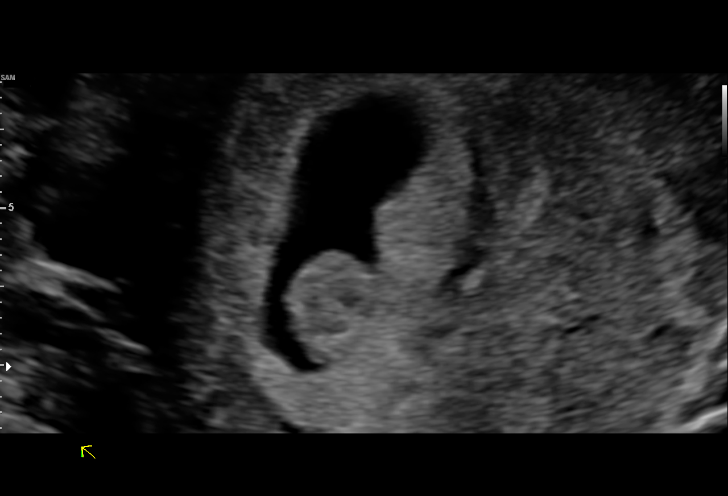
[im 16/25]
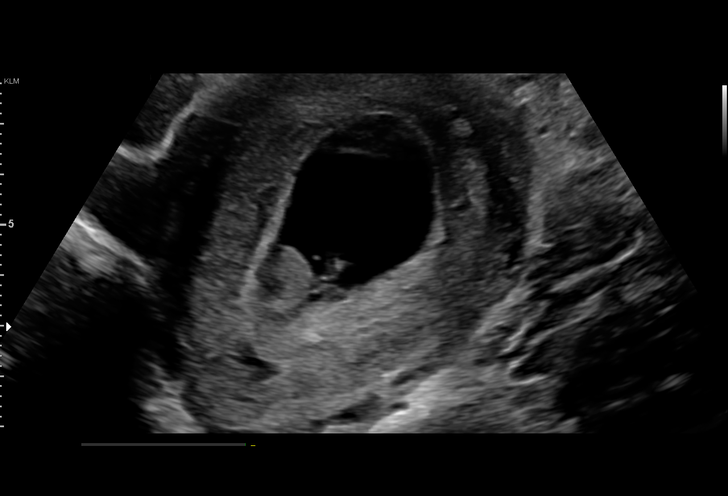
[im 18/25]
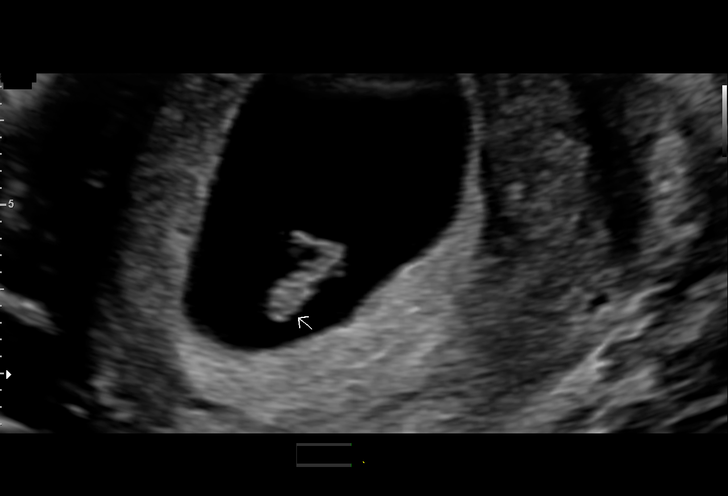
[im 20/25]
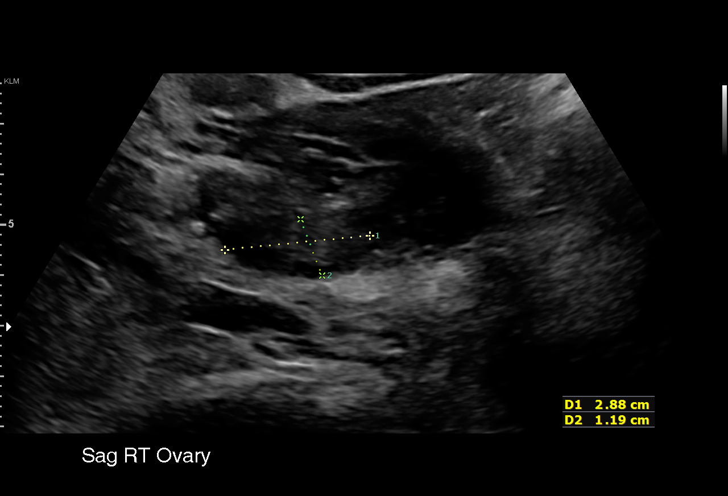
[im 21/25]
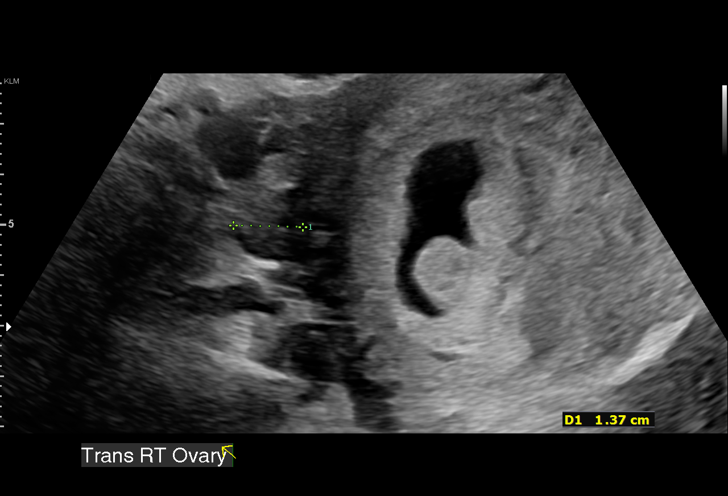
[im 23/25]
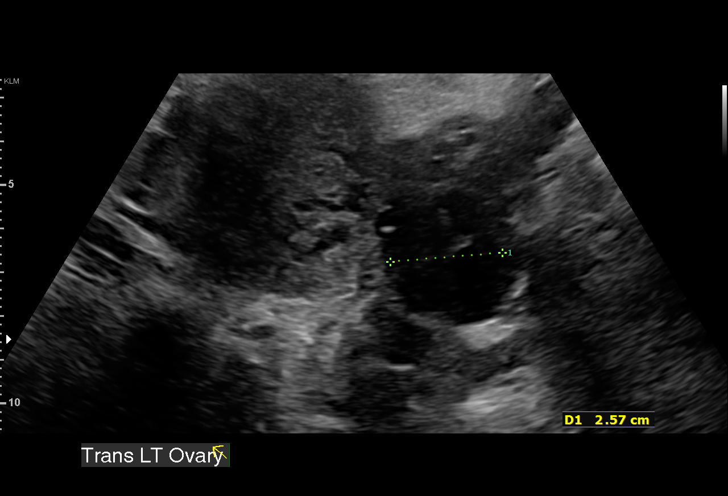
[im 25/25]
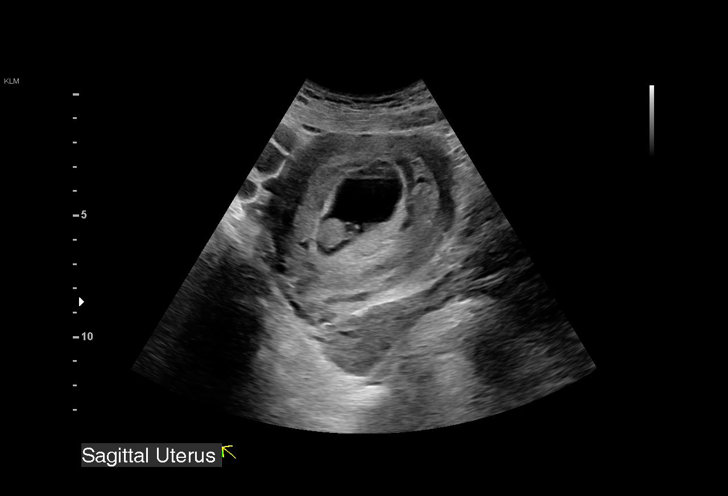

[15 of 25 positions shown; findings below may reference images not displayed]

FINDINGS: Intrauterine gestational sac: Single

Yolk sac:  Visible

Embryo:  Visible

Cardiac Activity: Detected

Heart Rate: 155 bpm

CRL:   12.3 mm   7 w 3 d                  US EDC: 01/25/2021

Subchorionic hemorrhage: None visualized. However, there is evidence
of a "chorionic bump" (image 15).

Maternal uterus/adnexae: No pelvic free fluid. Both ovaries appear
normal. The right ovary measures 2.9 x 1.2 x 1.4 cm. The left ovary
measures 3.1 x 2.3 x 2.6 cm and includes a small anechoic cyst
(image 21).
IMPRESSION: 1. Single living IUP demonstrated, with estimated gestational age of
7 weeks and 3 days by crown-rump length.
2. Negative for subchorionic hemorrhage or pelvic free fluid,
however, there is evidence of a "chorionic bump" (image 15).

## 2022-09-12 ENCOUNTER — Emergency Department (HOSPITAL_COMMUNITY)
Admission: EM | Admit: 2022-09-12 | Discharge: 2022-09-13 | Disposition: A | Discharge status: Home / Self Care | Payer: Self-pay | Attending: Emergency Medicine | Admitting: Emergency Medicine

## 2022-09-12 ENCOUNTER — Encounter (HOSPITAL_COMMUNITY): Payer: Self-pay

## 2022-09-12 ENCOUNTER — Emergency Department (HOSPITAL_COMMUNITY): Payer: Self-pay

## 2022-09-12 ENCOUNTER — Other Ambulatory Visit: Payer: Self-pay

## 2022-09-12 DIAGNOSIS — S1081XA Abrasion of other specified part of neck, initial encounter: Secondary | ICD-10-CM | POA: Insufficient documentation

## 2022-09-12 DIAGNOSIS — R0789 Other chest pain: Secondary | ICD-10-CM | POA: Diagnosis present

## 2022-09-12 DIAGNOSIS — Y92410 Unspecified street and highway as the place of occurrence of the external cause: Secondary | ICD-10-CM | POA: Diagnosis not present

## 2022-09-12 DIAGNOSIS — T148XXA Other injury of unspecified body region, initial encounter: Secondary | ICD-10-CM

## 2022-09-12 DIAGNOSIS — M7918 Myalgia, other site: Secondary | ICD-10-CM

## 2022-09-12 LAB — POC URINE PREG, ED: Preg Test, Ur: NEGATIVE
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
## 2022-09-13 MED ORDER — IBUPROFEN 600 MG PO TABS: 600.0000 mg | ORAL_TABLET | Freq: Four times a day (QID) | ORAL | 0 refills | Status: DC | PRN

## 2022-09-13 MED ORDER — IBUPROFEN 400 MG PO TABS
600.0000 mg | ORAL_TABLET | Freq: Once | ORAL | Status: AC
  Administered 2022-09-13: 600 mg via ORAL
  Filled 2022-09-13: qty 1

## 2022-09-13 MED ORDER — CYCLOBENZAPRINE HCL 10 MG PO TABS
5.0000 mg | ORAL_TABLET | Freq: Once | ORAL | Status: AC
  Administered 2022-09-13: 5 mg via ORAL
  Filled 2022-09-13: qty 1

## 2022-09-13 MED ORDER — CYCLOBENZAPRINE HCL 5 MG PO TABS
5.0000 mg | ORAL_TABLET | Freq: Two times a day (BID) | ORAL | 0 refills | Status: DC | PRN
Start: 2022-09-13 — End: 2024-01-02

## 2022-09-13 NOTE — ED Notes (Signed)
Provider in w pt.
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
## 2023-01-03 ENCOUNTER — Encounter (HOSPITAL_COMMUNITY): Payer: Self-pay

## 2023-01-03 ENCOUNTER — Emergency Department (HOSPITAL_COMMUNITY)
Admission: EM | Admit: 2023-01-03 | Discharge: 2023-01-04 | Disposition: A | Discharge status: Home / Self Care | Payer: Self-pay | Attending: Emergency Medicine | Admitting: Emergency Medicine

## 2023-01-03 DIAGNOSIS — Z8616 Personal history of COVID-19: Secondary | ICD-10-CM | POA: Insufficient documentation

## 2023-01-03 DIAGNOSIS — Z1152 Encounter for screening for COVID-19: Secondary | ICD-10-CM | POA: Insufficient documentation

## 2023-01-03 DIAGNOSIS — J101 Influenza due to other identified influenza virus with other respiratory manifestations: Secondary | ICD-10-CM | POA: Insufficient documentation

## 2023-01-03 MED ORDER — ACETAMINOPHEN 500 MG PO TABS
1000.0000 mg | ORAL_TABLET | Freq: Once | ORAL | Status: AC
  Administered 2023-01-03: 1000 mg via ORAL
  Filled 2023-01-03: qty 2

## 2023-01-03 NOTE — ED Triage Notes (Signed)
Pt to ED c/o FLU SX  x 1 week, reports known sick contacts- sick child, c/o body aches, sore throat, emesis.

## 2023-01-03 NOTE — ED Provider Triage Note (Signed)
Emergency Medicine Provider Triage Evaluation Note  Allison Ferrell , a 22 y.o. female  was evaluated in triage.  Pt complains of flu like symptoms x1 week.  Son sick with influenza B.  Tylenol taken this morning.  Review of Systems  Positive: Flu like symptoms Negative: vomiting  Physical Exam  BP 118/76   Pulse (!) 116   Temp (!) 101.1 F (38.4 C) (Oral)   Resp 18   Ht '5\' 4"'$  (1.626 m)   Wt 93 kg   LMP 12/27/2022   SpO2 100%   BMI 35.19 kg/m   Gen:   Awake, no distress   Resp:  Normal effort  MSK:   Moves extremities without difficulty  Other:  Congestion, dry cough noted  Medical Decision Making  Medically screening exam initiated at 10:16 PM.  Appropriate orders placed.  Eislee Ligman was informed that the remainder of the evaluation will be completed by another provider, this initial triage assessment does not replace that evaluation, and the importance of remaining in the ED until their evaluation is complete.  Flu like symptoms, son has Flu B.  RVP sent.  Febrile in triage to 101F but non-toxic, given tylenol.   Larene Pickett, PA-C 01/03/23 2217

## 2023-01-03 NOTE — ED Notes (Signed)
E-Signature pad broken, pt gives verbal consent.

## 2023-01-04 LAB — RESP PANEL BY RT-PCR (RSV, FLU A&B, COVID)  RVPGX2
Influenza A by PCR: NEGATIVE
Influenza B by PCR: POSITIVE — AB
Resp Syncytial Virus by PCR: NEGATIVE
SARS Coronavirus 2 by RT PCR: NEGATIVE

## 2023-01-04 MED ORDER — BENZONATATE 100 MG PO CAPS: 100.0000 mg | ORAL_CAPSULE | Freq: Three times a day (TID) | ORAL | 0 refills | Status: DC

## 2023-01-04 MED ORDER — GUAIFENESIN ER 600 MG PO TB12: 600.0000 mg | ORAL_TABLET | Freq: Two times a day (BID) | ORAL | 0 refills | Status: DC

## 2023-01-04 NOTE — ED Provider Notes (Signed)
[facility] Provider Note   CSN: [id number] Arrival date & time: 01/03/23  2205     History  Chief Complaint  Patient presents with   flu sx    Allison Ferrell is a 22 y.o. female.  The history is provided by the patient and medical records.   22 y.o. F here with 1 week of flu like symptoms--cough, nasal congestion, rhinorrhea, and fever.  Son recently diagnosed with influenza B.  She has been taking Tylenol for fever but none taken since this morning.  She has not had any vomiting.  No chest pain or shortness of breath.  Home Medications Prior to Admission medications   Medication Sig Start Date End Date Taking? Authorizing Provider  acetaminophen (TYLENOL) 325 MG tablet Take 2 tablets (650 mg total) by mouth every 4 (four) hours as needed (for pain scale < 4). Patient taking differently: Take 650 mg by mouth every 4 (four) hours as needed for mild pain. 01/27/21  Yes Alcus Dad, MD  ascorbic acid (VITAMIN C) 500 MG tablet Take 1 tablet (500 mg total) by mouth every other day. Take with iron tablet Patient not taking: Reported on 01/03/2023 11/29/20   Laury Deep, CNM  coconut oil OIL Apply 1 application topically as needed. Patient not taking: Reported on 01/03/2023 01/27/21   Alcus Dad, MD  cyclobenzaprine (FLEXERIL) 5 MG tablet Take 1 tablet (5 mg total) by mouth 2 (two) times daily as needed for muscle spasms. Patient not taking: Reported on 01/03/2023 09/13/22   Horton, Barbette Hair, MD  Elastic Bandages & Supports (COMFORT FIT MATERNITY SUPP SM) MISC 1 Units by Does not apply route daily as needed. 10/07/20   Laury Deep, CNM  ferrous sulfate (FERROUSUL) 325 (65 FE) MG tablet Take 1 tablet (325 mg total) by mouth every other day. Take with Vitamin C tablet Patient not taking: Reported on 01/03/2023 11/29/20   Laury Deep, CNM  ibuprofen (ADVIL) 600 MG tablet Take 1 tablet (600 mg total) by mouth every 6 (six)  hours. Patient not taking: Reported on 01/03/2023 01/27/21   Alcus Dad, MD  ibuprofen (ADVIL) 600 MG tablet Take 1 tablet (600 mg total) by mouth every 6 (six) hours as needed. Patient not taking: Reported on 01/03/2023 09/13/22   Horton, Barbette Hair, MD  Prenat-Fe Poly-Methfol-FA-DHA (VITAFOL ULTRA) 29-0.6-0.4-200 MG CAPS Take 1 tablet by mouth daily. Patient not taking: Reported on 01/03/2023 08/18/20   Gabriel Carina, CNM      Allergies    Patient has no known allergies.    Review of Systems   Review of Systems  Constitutional:  Positive for fever.  HENT:  Positive for congestion.   Respiratory:  Positive for cough.   All other systems reviewed and are negative.   Physical Exam Updated Vital Signs BP 117/72   Pulse 79   Temp (!) 101.1 F (38.4 C) (Oral)   Resp 16   Ht '5\' 4"'$  (1.626 m)   Wt 93 kg   LMP 12/27/2022   SpO2 99%   BMI 35.19 kg/m   Physical Exam Vitals and nursing note reviewed.  Constitutional:      Appearance: She is well-developed.  HENT:     Head: Normocephalic and atraumatic.     Nose: Congestion and rhinorrhea present. Rhinorrhea is clear.  Eyes:     Conjunctiva/sclera: Conjunctivae normal.     Pupils: Pupils are equal, round, and reactive to light.  Cardiovascular:  Rate and Rhythm: Normal rate and regular rhythm.     Heart sounds: Normal heart sounds.  Pulmonary:     Effort: Pulmonary effort is normal.     Breath sounds: Normal breath sounds.  Abdominal:     General: Bowel sounds are normal.     Palpations: Abdomen is soft.  Musculoskeletal:        General: Normal range of motion.     Cervical back: Normal range of motion.  Skin:    General: Skin is warm and dry.  Neurological:     Mental Status: She is alert and oriented to person, place, and time.     ED Results / Procedures / Treatments   Labs (all labs ordered are listed, but only abnormal results are displayed) Labs Reviewed  RESP PANEL BY RT-PCR (RSV, FLU A&B, COVID)   RVPGX2 - Abnormal; Notable for the following components:      Result Value   Influenza B by PCR POSITIVE (*)    All other components within normal limits    EKG None  Radiology No results found.  Procedures Procedures    Medications Ordered in ED Medications  acetaminophen (TYLENOL) tablet 1,000 mg (1,000 mg Oral Given 01/03/23 2220)    ED Course/ Medical Decision Making/ A&P                             Medical Decision Making Risk OTC drugs. Prescription drug management.   22 year old female presenting to the ED with flulike symptoms over the past week.  Son recently diagnosed with influenza B.  Febrile here in triage but nontoxic in appearance.  RVP is positive for influenza B.  She is out of the window to initiate Tamiflu so we will discharge home with symptomatic care.  Work note provided.  Encouraged to rest and hydrate.  Can follow-up with PCP.  Return here for new concerns.  Final Clinical Impression(s) / ED Diagnoses Final diagnoses:  Influenza B    Rx / DC Orders ED Discharge Orders          Ordered    benzonatate (TESSALON) 100 MG capsule  Every 8 hours        01/04/23 0327    guaiFENesin (MUCINEX) 600 MG 12 hr tablet  2 times daily        01/04/23 0327              Larene Pickett, PA-C 01/04/23 [id number]    Ripley Fraise, MD 01/04/23 [phone]

## 2023-01-04 NOTE — Discharge Instructions (Signed)
You tested positive for flu B today. Can take prescribed medication as directed.  Make sure to rest and hydrate. Follow-up with your primary care doctor. Return to the ED for new or worsening symptoms.

## 2024-01-02 ENCOUNTER — Encounter: Payer: Self-pay | Admitting: Family

## 2024-01-02 ENCOUNTER — Ambulatory Visit (INDEPENDENT_AMBULATORY_CARE_PROVIDER_SITE_OTHER): Payer: No Typology Code available for payment source | Admitting: Family

## 2024-01-02 ENCOUNTER — Telehealth: Payer: Self-pay | Admitting: Family

## 2024-01-02 ENCOUNTER — Other Ambulatory Visit (HOSPITAL_BASED_OUTPATIENT_CLINIC_OR_DEPARTMENT_OTHER): Payer: Self-pay

## 2024-01-02 VITALS — BP 121/71 | HR 72 | Temp 98.8°F | Resp 16 | Ht 63.3 in | Wt 213.0 lb

## 2024-01-02 DIAGNOSIS — Z309 Encounter for contraceptive management, unspecified: Secondary | ICD-10-CM | POA: Diagnosis not present

## 2024-01-02 DIAGNOSIS — Z6837 Body mass index (BMI) 37.0-37.9, adult: Secondary | ICD-10-CM | POA: Insufficient documentation

## 2024-01-02 DIAGNOSIS — D649 Anemia, unspecified: Secondary | ICD-10-CM | POA: Insufficient documentation

## 2024-01-02 DIAGNOSIS — E66812 Obesity, class 2: Secondary | ICD-10-CM

## 2024-01-02 DIAGNOSIS — Z833 Family history of diabetes mellitus: Secondary | ICD-10-CM | POA: Diagnosis not present

## 2024-01-02 DIAGNOSIS — F419 Anxiety disorder, unspecified: Secondary | ICD-10-CM | POA: Insufficient documentation

## 2024-01-02 DIAGNOSIS — H6993 Unspecified Eustachian tube disorder, bilateral: Secondary | ICD-10-CM | POA: Insufficient documentation

## 2024-01-02 DIAGNOSIS — G8929 Other chronic pain: Secondary | ICD-10-CM

## 2024-01-02 DIAGNOSIS — R519 Headache, unspecified: Secondary | ICD-10-CM | POA: Insufficient documentation

## 2024-01-02 LAB — COMPREHENSIVE METABOLIC PANEL
ALT: 9 U/L (ref 0–35)
AST: 12 U/L (ref 0–37)
Albumin: 4.3 g/dL (ref 3.5–5.2)
Alkaline Phosphatase: 64 U/L (ref 39–117)
BUN: 9 mg/dL (ref 6–23)
CO2: 27 meq/L (ref 19–32)
Calcium: 8.8 mg/dL (ref 8.4–10.5)
Chloride: 105 meq/L (ref 96–112)
Creatinine, Ser: 0.55 mg/dL (ref 0.40–1.20)
GFR: 128.19 mL/min (ref >60.00)
Glucose, Bld: 105 mg/dL — ABNORMAL HIGH (ref 70–99)
Potassium: 4.3 meq/L (ref 3.5–5.1)
Sodium: 139 meq/L (ref 135–145)
Total Bilirubin: 0.8 mg/dL (ref 0.2–1.2)
Total Protein: 7.3 g/dL (ref 6.0–8.3)

## 2024-01-02 LAB — CBC WITH DIFFERENTIAL/PLATELET
Basophils Absolute: 0 10*3/uL (ref 0.0–0.1)
Basophils Relative: 0.4 % (ref 0.0–3.0)
Eosinophils Absolute: 0.3 10*3/uL (ref 0.0–0.7)
Eosinophils Relative: 3.8 % (ref 0.0–5.0)
HCT: 37.6 % (ref 36.0–46.0)
Hemoglobin: 12.3 g/dL (ref 12.0–15.0)
Lymphocytes Relative: 34 % (ref 12.0–46.0)
Lymphs Abs: 2.7 10*3/uL (ref 0.7–4.0)
MCHC: 32.6 g/dL (ref 30.0–36.0)
MCV: 89.6 fL (ref 78.0–100.0)
Monocytes Absolute: 0.5 10*3/uL (ref 0.1–1.0)
Monocytes Relative: 6.6 % (ref 3.0–12.0)
Neutro Abs: 4.4 10*3/uL (ref 1.4–7.7)
Neutrophils Relative %: 55.2 % (ref 43.0–77.0)
Platelets: 396 10*3/uL (ref 150.0–400.0)
RBC: 4.19 Mil/uL (ref 3.87–5.11)
RDW: 13.7 % (ref 11.5–15.5)
WBC: 8 10*3/uL (ref 4.0–10.5)

## 2024-01-02 LAB — LIPID PANEL
Cholesterol: 172 mg/dL (ref 0–200)
HDL: 54.2 mg/dL (ref >39.00)
LDL Cholesterol: 105 mg/dL — ABNORMAL HIGH (ref 0–99)
NonHDL: 117.89
Total CHOL/HDL Ratio: 3
Triglycerides: 64 mg/dL (ref 0.0–149.0)
VLDL: 12.8 mg/dL (ref 0.0–40.0)

## 2024-01-02 LAB — POCT URINE PREGNANCY: Preg Test, Ur: NEGATIVE

## 2024-01-02 LAB — HEMOGLOBIN A1C: Hgb A1c MFr Bld: 5.9 % (ref 4.6–6.5)

## 2024-01-02 MED ORDER — LORATADINE 10 MG PO TABS: 10.0000 mg | ORAL_TABLET | Freq: Every day | ORAL | Status: DC

## 2024-01-02 MED ORDER — FLUTICASONE PROPIONATE 50 MCG/ACT NA SUSP: 2.0000 | Freq: Every day | NASAL | Status: DC

## 2024-01-02 MED ORDER — NORETHIN ACE-ETH ESTRAD-FE 1.5-30 MG-MCG PO TABS
1.0000 | ORAL_TABLET | Freq: Every day | ORAL | 4 refills | Status: DC
  Filled 2024-01-02: qty 84, 84d supply, fill #0

## 2024-01-02 MED ORDER — NORETHIN ACE-ETH ESTRAD-FE 1.5-30 MG-MCG PO TABS: 1.0000 | ORAL_TABLET | Freq: Every day | ORAL | 4 refills | Status: DC
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
## 2024-01-02 NOTE — Assessment & Plan Note (Signed)
 Update cbc, iron studies.

## 2024-01-02 NOTE — Assessment & Plan Note (Signed)
 Obtain A1C.

## 2024-01-02 NOTE — Assessment & Plan Note (Signed)
 Likely cause for ear fullness. Advised trial of claritin and flonase.
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
## 2024-01-02 NOTE — Assessment & Plan Note (Signed)
  The patient expressed a desire to start birth control and has chosen oral contraceptive pills. Will begin microgestin 1.5/30.She has been informed of the need to use backup contraception for the first two weeks.   -Urine pregnancy test is negative.

## 2024-01-02 NOTE — Assessment & Plan Note (Signed)
 Discussed importance of healthy diet, exercise and weight loss.  Discussed goal weight of 140 lb.
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
## 2024-01-02 NOTE — Telephone Encounter (Signed)
 See mychart.

## 2024-01-03 LAB — IRON,TIBC AND FERRITIN PANEL
%SAT: 15 % — ABNORMAL LOW (ref 16–45)
Ferritin: 5 ng/mL — ABNORMAL LOW (ref 16–154)
Iron: 64 ug/dL (ref 40–190)
TIBC: 438 ug/dL (ref 250–450)

## 2024-01-03 MED ORDER — MULTI-VITAMIN/MINERALS PO TABS: 1.0000 | ORAL_TABLET | Freq: Every day | ORAL | Status: AC

## 2024-01-07 ENCOUNTER — Encounter: Payer: Self-pay | Admitting: Family

## 2024-01-29 ENCOUNTER — Ambulatory Visit: Payer: No Typology Code available for payment source | Admitting: Family

## 2024-02-06 ENCOUNTER — Ambulatory Visit: Admitting: Family

## 2024-04-22 ENCOUNTER — Ambulatory Visit (INDEPENDENT_AMBULATORY_CARE_PROVIDER_SITE_OTHER): Payer: No Typology Code available for payment source | Admitting: Diagnostic Neuroimaging

## 2024-04-22 ENCOUNTER — Encounter: Payer: Self-pay | Admitting: Diagnostic Neuroimaging

## 2024-04-22 VITALS — BP 109/72 | HR 81 | Ht 64.0 in | Wt 226.0 lb

## 2024-04-22 DIAGNOSIS — R519 Headache, unspecified: Secondary | ICD-10-CM

## 2024-04-22 DIAGNOSIS — G44209 Tension-type headache, unspecified, not intractable: Secondary | ICD-10-CM
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
## 2024-11-01 ENCOUNTER — Emergency Department (HOSPITAL_COMMUNITY)
Admission: EM | Admit: 2024-11-01 | Discharge: 2024-11-02 | Disposition: A | Attending: Emergency Medicine | Admitting: Emergency Medicine

## 2024-11-01 ENCOUNTER — Encounter (HOSPITAL_COMMUNITY): Payer: Self-pay

## 2024-11-01 DIAGNOSIS — J101 Influenza due to other identified influenza virus with other respiratory manifestations: Secondary | ICD-10-CM | POA: Diagnosis not present

## 2024-11-01 DIAGNOSIS — R509 Fever, unspecified: Secondary | ICD-10-CM | POA: Diagnosis present

## 2024-11-01 MED ORDER — ACETAMINOPHEN 325 MG PO TABS
650.0000 mg | ORAL_TABLET | Freq: Once | ORAL | Status: AC | PRN
  Administered 2024-11-01: 650 mg via ORAL
  Filled 2024-11-01: qty 2

## 2024-11-01 NOTE — ED Triage Notes (Signed)
 Pt to ED c/o body aches, sore throat, cough, body aches x 2 days. Reports last took tylenol  this morning. Reports whole family is sick.,

## 2024-11-02 LAB — RESP PANEL BY RT-PCR (RSV, FLU A&B, COVID)  RVPGX2
Influenza A by PCR: POSITIVE — AB
Influenza B by PCR: NEGATIVE
Resp Syncytial Virus by PCR: NEGATIVE
SARS Coronavirus 2 by RT PCR: NEGATIVE

## 2024-11-02 MED ORDER — OSELTAMIVIR PHOSPHATE 75 MG PO CAPS: 75.0000 mg | ORAL_CAPSULE | Freq: Two times a day (BID) | ORAL | 0 refills | Status: AC

## 2024-11-02 MED ORDER — IBUPROFEN 800 MG PO TABS
800.0000 mg | ORAL_TABLET | Freq: Once | ORAL | Status: AC
  Administered 2024-11-02: 800 mg via ORAL
  Filled 2024-11-02: qty 1

## 2024-11-02 NOTE — ED Notes (Signed)
 Fever increasing.  PA notified.  States to give ibuprofen  if pt not pregnant.  Pt denies pregnancy at this time, stating she is on her menstrual cycle today.

## 2024-11-02 NOTE — ED Provider Notes (Signed)
 " [facility] Emergency Department Provider Note MRN:  [id number]  Arrival date & time: 11/02/2024     Chief Complaint   flu sx   History of Present Illness   Allison Ferrell is a 23 y.o. year-old female presents to the ED with chief complaint of 4  days of body aches, fever, cough.  States that family is sick with the same.  States that she has had Tylenol  and motrin  with some improvement.  History provided by patient.   Review of Systems  Pertinent positive and negative review of systems noted in HPI.    Physical Exam   Vitals:   11/01/24 2340 11/02/24 0354  BP: 130/79 113/76  Pulse: (!) 115 (!) 105  Resp: 20 19  Temp: (!) 103.3 F (39.6 C) 98.4 F (36.9 C)  SpO2: 98% 100%    CONSTITUTIONAL:  non toxic-appearing, NAD NEURO:  Alert and oriented x 3, CN 3-12 grossly intact EYES:  eyes equal and reactive ENT/NECK:  Supple, no stridor  CARDIO:  mildly tachycardic on my exam, regular rhythm, appears well-perfused  PULM:  No respiratory distress, CTAB GI/GU:  non-distended,  MSK/SPINE:  No gross deformities, no edema, moves all extremities  SKIN:  no rash, atraumatic   *Additional and/or pertinent findings included in MDM below  Diagnostic and Interventional Summary    EKG Interpretation Date/Time:    Ventricular Rate:    PR Interval:    QRS Duration:    QT Interval:    QTC Calculation:   R Axis:      Text Interpretation:         Labs Reviewed  RESP PANEL BY RT-PCR (RSV, FLU A&B, COVID)  [id number] - Abnormal; Notable for the following components:      Result Value   Influenza A by PCR POSITIVE (*)    All other components within normal limits    No orders to display    Medications  acetaminophen  (TYLENOL ) tablet 650 mg (650 mg Oral Given 11/01/24 2327)  ibuprofen  (ADVIL ) tablet 800 mg (800 mg Oral Given 11/02/24 0020)     Procedures  /  Critical Care Procedures  ED Course and Medical Decision Making  I have reviewed  the triage vital signs, the nursing notes, and pertinent available records from the EMR.  Social Determinants Affecting Complexity of Care: Patient has no clinically significant social determinants affecting this chief complaint..   ED Course:    Medical Decision Making Patient here with flu symptoms.  Family sick with the same.  Doesn't appear toxic.  Will treat with Tamiflu .  PCP follow-up.  Return if worsening.  Risk OTC drugs. Prescription drug management.         Consultants: No consultations were needed in caring for this patient.   Treatment and Plan: Emergency department workup does not suggest an emergent condition requiring admission or immediate intervention beyond  what has been performed at this time. The patient is safe for discharge and has  been instructed to return immediately for worsening symptoms, change in  symptoms or any other concerns    Final Clinical Impressions(s) / ED Diagnoses     ICD-10-CM   1. Influenza A  J10.1       ED Discharge Orders          Ordered    oseltamivir  (TAMIFLU ) 75 MG capsule  Every 12 hours        11/02/24 0407  Discharge Instructions Discussed with and Provided to Patient:   Discharge Instructions   None      Vicky Charleston, PA-C 11/02/24 0407    Darra Fonda MATSU, MD 11/02/24 [phone]  "
